# Patient Record
Sex: Male | Born: 1970 | Hispanic: Yes | Smoking: Never smoker
Health system: Southern US, Community
[De-identification: ages and names within clinical notes are randomized; demographics above are authoritative.]

## PROBLEM LIST (undated history)

## (undated) DIAGNOSIS — E119 Type 2 diabetes mellitus without complications: Secondary | ICD-10-CM

## (undated) DIAGNOSIS — E785 Hyperlipidemia, unspecified: Secondary | ICD-10-CM

---

## 2015-12-20 ENCOUNTER — Ambulatory Visit (INDEPENDENT_AMBULATORY_CARE_PROVIDER_SITE_OTHER): Payer: 59 | Admitting: Physician Assistant

## 2015-12-20 ENCOUNTER — Ambulatory Visit (INDEPENDENT_AMBULATORY_CARE_PROVIDER_SITE_OTHER): Payer: 59

## 2015-12-20 VITALS — BP 120/82 | HR 83 | Temp 98.3°F | Resp 16 | Ht 64.5 in | Wt 163.0 lb

## 2015-12-20 DIAGNOSIS — L03811 Cellulitis of head [any part, except face]: Secondary | ICD-10-CM

## 2015-12-20 MED ORDER — DOXYCYCLINE HYCLATE 100 MG PO CAPS: 100.0000 mg | ORAL_CAPSULE | Freq: Two times a day (BID) | ORAL | Status: AC

## 2015-12-20 NOTE — Progress Notes (Signed)
[facility] [address] [place] [address] [phone]  Date:  12/20/2015   Name:  Brian Rivas   DOB:  1971-03-17   MRN:  [id number]  PCP:  No primary care provider on file.    History of Present Illness:  Brian Rivas is a 45 y.o. male patient who presents to [facility] for cc of abscess on his neck.  This started about 1 week ago and has progressively grown.  He has had no drainage or fever.  There swelling and redness.  He has not taken any medications.     There are no active problems to display for this patient.   History reviewed. No pertinent past medical history.  History reviewed. No pertinent past surgical history.  Social History  Substance Use Topics  . Smoking status: Never Smoker   . Smokeless tobacco: None  . Alcohol Use: No    Family History  Problem Relation Age of Onset  . Cancer Mother     stomach  . Diabetes Father     No Known Allergies  Medication list has been reviewed and updated.  No current outpatient prescriptions on file prior to visit.   No current facility-administered medications on file prior to visit.    ROS ROS otherwise unremarkable unless listed above.   Physical Examination: BP 120/82 mmHg  Pulse 83  Temp(Src) 98.3 F (36.8 C) (Oral)  Resp 16  Ht 5' 4.5" (1.638 m)  Wt 163 lb (73.936 kg)  BMI 27.56 kg/m2  SpO2 97% Ideal Body Weight: Weight in (lb) to have BMI = 25: 147.6  Physical Exam  Constitutional: He is oriented to person, place, and time. He appears well-developed and well-nourished. No distress.  HENT:  Head: Normocephalic and atraumatic.  Eyes: Conjunctivae and EOM are normal. Pupils are equal, round, and reactive to light.  Neck:  Redness and firm with minimal fluctuance.  Central purlent entrance, however not open.    Cardiovascular: Normal rate.   Pulmonary/Chest: Effort normal. No respiratory distress.  Neurological: He is alert and oriented to person, place, and time.   Skin: Skin is warm and dry. He is not diaphoretic.  Psychiatric: He has a normal mood and affect. His behavior is normal.   Procedure: verbal consent obtained.  Cleansed with alcohol swab.  .5% Marcaine at abscess wound.  povidine swabbed.  11 blade utilized for 1.5cm incision.  Purulent fluid expressed and captured in culture.  Wound searched for loculations.  Irrigated with saline.  1/4 packing placed aggressively.     Assessment and Plan: Brian Rivas is a 45 y.o. male who is here today for cc of abscess of neck.   Placed on doxycycline.   Wound care discussed. rtc in 48 hrs for wound care.  Abscess or cellulitis of scalp - Plan: Wound culture, doxycycline (VIBRAMYCIN) 100 MG capsule  Trena Platt, PA-C [facility] 12/20/2015 3:04 PM

## 2015-12-20 NOTE — Patient Instructions (Addendum)
Please make sure that you are hydrating well. Please keep the wound dry.  I would like you to return in 48 hours for recheck.     Cuidados posteriores a una incisin y drenaje  (Incision and Drainage, Care After) Siga estas instrucciones durante las prximas semanas. Estas indicaciones le proporcionan informacin general acerca de cmo deber cuidarse despus del procedimiento. El mdico tambin podr darle instrucciones ms especficas. El tratamiento ha sido planificado segn las prcticas mdicas actuales, pero en algunos casos pueden ocurrir problemas. Comunquese con el mdico si tiene algn problema o tiene preguntas despus del procedimiento.  INSTRUCCIONES PARA EL CUIDADO EN EL HOGAR   Si le han recetado antibiticos, tmelos segn las indicaciones. Finalice la prescripcin completa, aunque se sienta mejor.  Slo tome medicamentos de venta libre o recetados para Primary school teacher, las molestias o bajar la fiebre segn las indicaciones de su mdico.  Cumpla con todas las visitas de control, segn le indique su mdico.  Cambie los apsitos vendajes tal como le indic su mdico. Reemplace los vendajes usados por vendajes limpios.  Lvese bien las manos antes y despus de curar la herida. Recibir instrucciones especficas para limpiar y cuidar la herida.  SOLICITE ATENCIN MDICA SI:   Aumenta el dolor, la hinchazn o el enrojecimiento en la herida.  Aumenta la secrecin, la herida tiene mal olor o [place].  Tiene dolores musculares, escalofros o siente Risk analyst.  Lance Muss. ASEGRESE DE QUE:   Comprende estas instrucciones.  Controlar su enfermedad.  Solicitar ayuda de inmediato si no mejora o si empeora.   Esta informacin no tiene Theme park manager el consejo del mdico. Asegrese de hacerle al mdico cualquier pregunta que tenga.   Document Released: 04/23/2012 Elsevier Interactive Patient Education [employer]

## 2015-12-22 ENCOUNTER — Ambulatory Visit (INDEPENDENT_AMBULATORY_CARE_PROVIDER_SITE_OTHER): Payer: 59 | Admitting: Physician Assistant

## 2015-12-22 VITALS — BP 124/80 | HR 81 | Temp 97.4°F | Resp 17 | Ht 64.5 in | Wt 162.0 lb

## 2015-12-22 DIAGNOSIS — Z4889 Encounter for other specified surgical aftercare: Secondary | ICD-10-CM

## 2015-12-22 DIAGNOSIS — L03811 Cellulitis of head [any part, except face]: Secondary | ICD-10-CM

## 2015-12-22 DIAGNOSIS — Z9889 Other specified postprocedural states: Secondary | ICD-10-CM

## 2015-12-22 NOTE — Progress Notes (Signed)
[facility] [address], [place] [address] [phone]  Date:  12/22/2015   Name:  Brian Rivas   DOB:  1971-07-26   MRN:  [id number]  PCP:  No primary care provider on file.    History of Present Illness:  Brian Rivas is a 45 y.o. male patient who presents to [facility] for follow up wound care. Patient was seen here 2 days ago for an abscess of his lower left scalp status post incision and drainage.  Patient reports a lot of mucus drainage.  He has had no fever.  It is less painful, and swelling has gone down.  He is compliant on the doxycycline.      There are no active problems to display for this patient.   No past medical history on file.  No past surgical history on file.  Social History  Substance Use Topics  . Smoking status: Never Smoker   . Smokeless tobacco: None  . Alcohol Use: No    Family History  Problem Relation Age of Onset  . Cancer Mother     stomach  . Diabetes Father     No Known Allergies  Medication list has been reviewed and updated.  Current Outpatient Prescriptions on File Prior to Visit  Medication Sig Dispense Refill  . doxycycline (VIBRAMYCIN) 100 MG capsule Take 1 capsule (100 mg total) by mouth 2 (two) times daily. 20 capsule 0  . ibuprofen (ADVIL,MOTRIN) 200 MG tablet Take 200 mg by mouth every 6 (six) hours as needed.     No current facility-administered medications on file prior to visit.    ROS   Physical Examination: BP 124/80 mmHg  Pulse 81  Temp(Src) 97.4 F (36.3 C) (Oral)  Resp 17  Ht 5' 4.5" (1.638 m)  Wt 162 lb (73.483 kg)  BMI 27.39 kg/m2  SpO2 97% Ideal Body Weight: Weight in (lb) to have BMI = 25: 147.6  Physical Exam  Constitutional: He is oriented to person, place, and time. He appears well-developed and well-nourished. No distress.  HENT:  Head: Normocephalic and atraumatic.    Packing in placed.  Minimal induration, and there is erythema surround incision site.      Eyes: Conjunctivae and EOM are normal. Pupils are equal, round, and reactive to light.  Cardiovascular: Normal rate.   Pulmonary/Chest: Effort normal. No respiratory distress.  Neurological: He is alert and oriented to person, place, and time.  Skin: Skin is warm and dry. He is not diaphoretic.  Psychiatric: He has a normal mood and affect. His behavior is normal.   Procedure: verbal consent obtained.  Purulent drainage expressed.  Irrigated with normal saline.  Beefy red tissue seen in the wound.  1/4 packing gently placed.  Cleansed with normal saline.  Dressings applied.    Assessment and Plan: Brian Rivas is a 45 y.o. male who is here today for follow up of abscess status post drainage. Advised continued doxycycline.  Preliminary results shows abundant staph aureus thus far. rtc in 48 hours.  Abscess or cellulitis of scalp  Status post incision and drainage  Encounter for postoperative wound care  Trena Platt, PA-C [facility] 2/15/20172:43 PM

## 2015-12-22 NOTE — Patient Instructions (Signed)
Continue the antibiotic at this time.  Please keep this area from getting wet.  If it does, I would like you to change the top dressing.   This is healing well.

## 2015-12-23 LAB — WOUND CULTURE: GRAM STAIN: NONE SEEN

## 2015-12-24 ENCOUNTER — Ambulatory Visit (INDEPENDENT_AMBULATORY_CARE_PROVIDER_SITE_OTHER): Payer: 59 | Admitting: Physician Assistant

## 2015-12-24 VITALS — BP 118/80 | HR 72 | Temp 98.0°F | Resp 16 | Ht 64.0 in | Wt 164.0 lb

## 2015-12-24 DIAGNOSIS — Z9889 Other specified postprocedural states: Secondary | ICD-10-CM

## 2015-12-24 DIAGNOSIS — Z4889 Encounter for other specified surgical aftercare: Secondary | ICD-10-CM

## 2015-12-24 NOTE — Patient Instructions (Signed)
Remove packing in 2 days. Wash area with soap. This can be antibacterial soap. Just no deodorant soap or perfume soap. Keep a bandage cover the wound until it heals. If you notice any redness, increased swelling, drainage, or pain, please return to the clinic or go to the nearest hospital.

## 2015-12-24 NOTE — Progress Notes (Signed)
[facility] [address] [place] [address] [phone]  Date:  12/24/2015   Name:  Brian Rivas   DOB:  08-May-1971   MRN:  [id number]  PCP:  No primary care provider on file.    History of Present Illness:  Brian Rivas is a 45 y.o. male patient who presents to [facility] for wound care follow up.  6th day status post incision and drainage of left lower scalp abscess.  Patient reports improvement of the abscess.  Minimal drainage.  No pain or fever.  Tolerating the doxycycline well.     There are no active problems to display for this patient.   History reviewed. No pertinent past medical history.  History reviewed. No pertinent past surgical history.  Social History  Substance Use Topics  . Smoking status: Never Smoker   . Smokeless tobacco: Never Used  . Alcohol Use: No    Family History  Problem Relation Age of Onset  . Cancer Mother     stomach  . Diabetes Father     No Known Allergies  Medication list has been reviewed and updated.  Current Outpatient Prescriptions on File Prior to Visit  Medication Sig Dispense Refill  . doxycycline (VIBRAMYCIN) 100 MG capsule Take 1 capsule (100 mg total) by mouth 2 (two) times daily. 20 capsule 0   No current facility-administered medications on file prior to visit.    ROS ROS otherwise unremakable unless listed above.    Physical Examination: BP 118/80 mmHg  Pulse 72  Temp(Src) 98 F (36.7 C) (Oral)  Resp 16  Ht  (1.626 m)  Wt 164 lb (74.39 kg)  BMI 28.14 kg/m2  SpO2 98% Ideal Body Weight: Weight in (lb) to have BMI = 25: 145.3  Physical Exam  Constitutional: He is oriented to person, place, and time. He appears well-developed and well-nourished. No distress.  HENT:  Head: Normocephalic and atraumatic.  Eyes: Conjunctivae and EOM are normal. Pupils are equal, round, and reactive to light.  Cardiovascular: Normal rate.   Pulmonary/Chest: Effort normal. No respiratory  distress.  Neurological: He is alert and oriented to person, place, and time.  Skin: Skin is warm and dry. He is not diaphoretic.  Wound site with minimal erythema at lower hair line.    Psychiatric: He has a normal mood and affect. His behavior is normal.   Procedure: verbal consent obtained. Packing removed.  Beefy red tissue without necrosis or purulent fluid.  Sanguinous fluid expressed.  Irrigated with saline.  Packing gently placed.   Assessment and Plan: Brian Rivas is a 45 y.o. male who is here today with follow up wound care. Repacked gently. This is healing well.  i have advised to remove packing in 48 hours.  Wound care advised.   Status post incision and drainage  Encounter for postoperative wound care  Trena Platt, PA-C [facility] 2/17/20177:09 PM

## 2016-05-15 ENCOUNTER — Ambulatory Visit: Payer: 59

## 2019-04-19 ENCOUNTER — Encounter (HOSPITAL_COMMUNITY): Payer: Self-pay | Admitting: Emergency Medicine

## 2019-04-19 ENCOUNTER — Inpatient Hospital Stay (HOSPITAL_COMMUNITY)
Admission: EM | Admit: 2019-04-19 | Discharge: 2019-04-23 | DRG: 177 | Disposition: A | Discharge status: Home / Self Care | Payer: 59 | Attending: Internal Medicine | Admitting: Internal Medicine

## 2019-04-19 ENCOUNTER — Other Ambulatory Visit: Payer: Self-pay

## 2019-04-19 ENCOUNTER — Emergency Department (HOSPITAL_COMMUNITY): Payer: 59

## 2019-04-19 DIAGNOSIS — R0602 Shortness of breath: Secondary | ICD-10-CM

## 2019-04-19 DIAGNOSIS — Z833 Family history of diabetes mellitus: Secondary | ICD-10-CM

## 2019-04-19 DIAGNOSIS — Z79899 Other long term (current) drug therapy: Secondary | ICD-10-CM | POA: Diagnosis not present

## 2019-04-19 DIAGNOSIS — E785 Hyperlipidemia, unspecified: Secondary | ICD-10-CM | POA: Diagnosis present

## 2019-04-19 DIAGNOSIS — E119 Type 2 diabetes mellitus without complications: Secondary | ICD-10-CM

## 2019-04-19 DIAGNOSIS — Z7984 Long term (current) use of oral hypoglycemic drugs: Secondary | ICD-10-CM | POA: Diagnosis not present

## 2019-04-19 DIAGNOSIS — J9601 Acute respiratory failure with hypoxia: Secondary | ICD-10-CM | POA: Diagnosis present

## 2019-04-19 DIAGNOSIS — R918 Other nonspecific abnormal finding of lung field: Secondary | ICD-10-CM

## 2019-04-19 DIAGNOSIS — R430 Anosmia: Secondary | ICD-10-CM | POA: Diagnosis present

## 2019-04-19 DIAGNOSIS — U071 COVID-19: Principal | ICD-10-CM | POA: Diagnosis present

## 2019-04-19 DIAGNOSIS — R0682 Tachypnea, not elsewhere classified: Secondary | ICD-10-CM

## 2019-04-19 DIAGNOSIS — J1289 Other viral pneumonia: Secondary | ICD-10-CM | POA: Diagnosis present

## 2019-04-19 DIAGNOSIS — J069 Acute upper respiratory infection, unspecified: Secondary | ICD-10-CM | POA: Diagnosis not present

## 2019-04-19 HISTORY — DX: Hyperlipidemia, unspecified: E78.5

## 2019-04-19 HISTORY — DX: Type 2 diabetes mellitus without complications: E11.9

## 2019-04-19 LAB — BASIC METABOLIC PANEL
Anion gap: 12 (ref 5–15)
BUN: 11 mg/dL (ref 6–20)
CO2: 23 mmol/L (ref 22–32)
Calcium: 8.5 mg/dL — ABNORMAL LOW (ref 8.9–10.3)
Chloride: 100 mmol/L (ref 98–111)
Creatinine, Ser: 0.79 mg/dL (ref 0.61–1.24)
GFR calc Af Amer: >60 mL/min (ref >60)
GFR calc non Af Amer: >60 mL/min (ref >60)
Glucose, Bld: 196 mg/dL — ABNORMAL HIGH (ref 70–99)
Potassium: 4.1 mmol/L (ref 3.5–5.1)
Sodium: 135 mmol/L (ref 135–145)

## 2019-04-19 LAB — BRAIN NATRIURETIC PEPTIDE: B Natriuretic Peptide: 28.7 pg/mL (ref 0.0–100.0)

## 2019-04-19 LAB — CBC WITH DIFFERENTIAL/PLATELET
Abs Immature Granulocytes: 0.01 10*3/uL (ref 0.00–0.07)
Basophils Absolute: 0 10*3/uL (ref 0.0–0.1)
Basophils Relative: 0 %
Eosinophils Absolute: 0 10*3/uL (ref 0.0–0.5)
Eosinophils Relative: 0 %
HCT: 47 % (ref 39.0–52.0)
Hemoglobin: 15.9 g/dL (ref 13.0–17.0)
Immature Granulocytes: 0 %
Lymphocytes Relative: 17 %
Lymphs Abs: 1.1 10*3/uL (ref 0.7–4.0)
MCH: 30.4 pg (ref 26.0–34.0)
MCHC: 33.8 g/dL (ref 30.0–36.0)
MCV: 89.9 fL (ref 80.0–100.0)
Monocytes Absolute: 0.5 10*3/uL (ref 0.1–1.0)
Monocytes Relative: 7 %
Neutro Abs: 5.1 10*3/uL (ref 1.7–7.7)
Neutrophils Relative %: 76 %
Platelets: 182 10*3/uL (ref 150–400)
RBC: 5.23 MIL/uL (ref 4.22–5.81)
RDW: 12.3 % (ref 11.5–15.5)
WBC: 6.7 10*3/uL (ref 4.0–10.5)
nRBC: 0 % (ref 0.0–0.2)

## 2019-04-19 LAB — HEPATIC FUNCTION PANEL
ALT: 22 U/L (ref 0–44)
AST: 28 U/L (ref 15–41)
Albumin: 2.9 g/dL — ABNORMAL LOW (ref 3.5–5.0)
Alkaline Phosphatase: 66 U/L (ref 38–126)
Bilirubin, Direct: 0.2 mg/dL (ref 0.0–0.2)
Indirect Bilirubin: 0.7 mg/dL (ref 0.3–0.9)
Total Bilirubin: 0.9 mg/dL (ref 0.3–1.2)
Total Protein: 6.7 g/dL (ref 6.5–8.1)

## 2019-04-19 LAB — HEMOGLOBIN A1C
Hgb A1c MFr Bld: 12.6 % — ABNORMAL HIGH (ref 4.8–5.6)
Mean Plasma Glucose: 314.92 mg/dL

## 2019-04-19 LAB — C-REACTIVE PROTEIN: CRP: 8.4 mg/dL — ABNORMAL HIGH (ref <1.0)

## 2019-04-19 LAB — FERRITIN: Ferritin: 283 ng/mL (ref 24–336)

## 2019-04-19 LAB — FIBRINOGEN: Fibrinogen: 686 mg/dL — ABNORMAL HIGH (ref 210–475)

## 2019-04-19 LAB — D-DIMER, QUANTITATIVE: D-Dimer, Quant: 1.4 ug/mL-FEU — ABNORMAL HIGH (ref 0.00–0.50)

## 2019-04-19 LAB — LACTATE DEHYDROGENASE: LDH: 274 U/L — ABNORMAL HIGH (ref 98–192)

## 2019-04-19 LAB — PROCALCITONIN: Procalcitonin: <0.1 ng/mL

## 2019-04-19 LAB — SARS CORONAVIRUS 2: SARS Coronavirus 2: DETECTED — AB

## 2019-04-19 LAB — GLUCOSE, CAPILLARY: Glucose-Capillary: 182 mg/dL — ABNORMAL HIGH (ref 70–99)

## 2019-04-19 LAB — LACTIC ACID, PLASMA: Lactic Acid, Venous: 1.3 mmol/L (ref 0.5–1.9)

## 2019-04-19 MED ORDER — BISACODYL 5 MG PO TBEC: 5.0000 mg | DELAYED_RELEASE_TABLET | Freq: Every day | ORAL | Status: DC | PRN

## 2019-04-19 MED ORDER — SODIUM CHLORIDE 0.9 % IV SOLN
500.0000 mg | Freq: Once | INTRAVENOUS | Status: AC
  Administered 2019-04-19: 500 mg via INTRAVENOUS
  Filled 2019-04-19: qty 500

## 2019-04-19 MED ORDER — POLYETHYLENE GLYCOL 3350 17 G PO PACK: 17.0000 g | PACK | Freq: Every day | ORAL | Status: DC | PRN

## 2019-04-19 MED ORDER — ACETAMINOPHEN 325 MG PO TABS
650.0000 mg | ORAL_TABLET | Freq: Once | ORAL | Status: AC
  Administered 2019-04-19: 650 mg via ORAL
  Filled 2019-04-19: qty 2

## 2019-04-19 MED ORDER — METHYLPREDNISOLONE SODIUM SUCC 125 MG IJ SOLR
80.0000 mg | Freq: Two times a day (BID) | INTRAMUSCULAR | Status: DC
  Administered 2019-04-19: 80 mg via INTRAVENOUS
  Filled 2019-04-19: qty 2

## 2019-04-19 MED ORDER — INSULIN ASPART 100 UNIT/ML ~~LOC~~ SOLN
0.0000 [IU] | Freq: Every day | SUBCUTANEOUS | Status: DC
  Administered 2019-04-20 – 2019-04-21 (×2): 2 [IU] via SUBCUTANEOUS
  Administered 2019-04-22: 22:00:00 5 [IU] via SUBCUTANEOUS

## 2019-04-19 MED ORDER — SODIUM CHLORIDE 0.9 % IV SOLN
1.0000 g | Freq: Once | INTRAVENOUS | Status: AC
  Administered 2019-04-19: 1 g via INTRAVENOUS
  Filled 2019-04-19: qty 10

## 2019-04-19 MED ORDER — SODIUM CHLORIDE 0.9 % IV SOLN
200.0000 mg | Freq: Once | INTRAVENOUS | Status: AC
  Administered 2019-04-19: 200 mg via INTRAVENOUS
  Filled 2019-04-19: qty 40

## 2019-04-19 MED ORDER — ONDANSETRON HCL 4 MG PO TABS: 4.0000 mg | ORAL_TABLET | Freq: Four times a day (QID) | ORAL | Status: DC | PRN

## 2019-04-19 MED ORDER — DOCUSATE SODIUM 100 MG PO CAPS
100.0000 mg | ORAL_CAPSULE | Freq: Two times a day (BID) | ORAL | Status: DC
  Administered 2019-04-20 – 2019-04-23 (×6): 100 mg via ORAL
  Filled 2019-04-19 (×7): qty 1

## 2019-04-19 MED ORDER — ONDANSETRON HCL 4 MG/2ML IJ SOLN: 4.0000 mg | Freq: Four times a day (QID) | INTRAMUSCULAR | Status: DC | PRN

## 2019-04-19 MED ORDER — SODIUM CHLORIDE 0.9 % IV SOLN
INTRAVENOUS | Status: DC
  Administered 2019-04-19: 22:00:00 via INTRAVENOUS

## 2019-04-19 MED ORDER — INSULIN ASPART 100 UNIT/ML ~~LOC~~ SOLN
0.0000 [IU] | Freq: Three times a day (TID) | SUBCUTANEOUS | Status: DC
  Administered 2019-04-20: 11 [IU] via SUBCUTANEOUS
  Administered 2019-04-20: 15 [IU] via SUBCUTANEOUS
  Administered 2019-04-20 – 2019-04-21 (×2): 5 [IU] via SUBCUTANEOUS
  Administered 2019-04-21 (×2): 3 [IU] via SUBCUTANEOUS
  Administered 2019-04-22: 17:00:00 8 [IU] via SUBCUTANEOUS
  Administered 2019-04-22 (×2): 5 [IU] via SUBCUTANEOUS
  Administered 2019-04-23: 8 [IU] via SUBCUTANEOUS
  Administered 2019-04-23: 5 [IU] via SUBCUTANEOUS

## 2019-04-19 MED ORDER — ATORVASTATIN CALCIUM 10 MG PO TABS
10.0000 mg | ORAL_TABLET | Freq: Every day | ORAL | Status: DC
  Administered 2019-04-20 – 2019-04-23 (×4): 10 mg via ORAL
  Filled 2019-04-19 (×4): qty 1

## 2019-04-19 MED ORDER — ENOXAPARIN SODIUM 40 MG/0.4ML ~~LOC~~ SOLN
40.0000 mg | SUBCUTANEOUS | Status: DC
  Administered 2019-04-20 – 2019-04-23 (×4): 40 mg via SUBCUTANEOUS
  Filled 2019-04-19 (×4): qty 0.4

## 2019-04-19 MED ORDER — SODIUM CHLORIDE 0.9 % IV SOLN
100.0000 mg | INTRAVENOUS | Status: DC
  Administered 2019-04-20 – 2019-04-22 (×3): 100 mg via INTRAVENOUS
  Filled 2019-04-19 (×3): qty 20

## 2019-04-19 MED ORDER — OXYCODONE HCL 5 MG PO TABS
5.0000 mg | ORAL_TABLET | ORAL | Status: DC | PRN
  Administered 2019-04-21 (×2): 5 mg via ORAL
  Filled 2019-04-19 (×2): qty 1

## 2019-04-19 MED ORDER — ACETAMINOPHEN 325 MG PO TABS
650.0000 mg | ORAL_TABLET | Freq: Four times a day (QID) | ORAL | Status: DC | PRN
  Administered 2019-04-19 – 2019-04-23 (×3): 650 mg via ORAL
  Filled 2019-04-19 (×3): qty 2

## 2019-04-19 NOTE — H&P (Signed)
History and Physical    Brian Rivas RUE:[id number] DOB: 21-Mar-1971 DOA: 04/19/2019  PCP: Patient, No Pcp Per Consultants:  None Patient coming from:  Home - lives with ; NOK: Daughter, [phone]  Chief Complaint: SOB  HPI: Brian Rivas is a 48 y.o. male with medical history significant of DM and HLD presenting with SOB.  He reports onset of a frontal headache on Wednesday.  He has had mild SOB.  + cough.  + fever.  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  ED Course:  SOB today.  Tachypnea to 30s, multifocal PNA on CXR.  COVID +.  O2 90-93% on RA.  Review of Systems: As per HPI; otherwise review of systems reviewed and negative.   Ambulatory Status:  Ambulates without assistance  Past Medical History:  Diagnosis Date  . Dyslipidemia   . Type 2 diabetes mellitus (HCC)     History reviewed. No pertinent surgical history.  Social History   Socioeconomic History  . Marital status: Divorced    Spouse name: Not on file  . Number of children: Not on file  . Years of education: Not on file  . Highest education level: Not on file  Occupational History  . Not on file  Social Needs  . Financial resource strain: Not on file  . Food insecurity    Worry: Not on file    Inability: Not on file  . Transportation needs    Medical: Not on file    Non-medical: Not on file  Tobacco Use  . Smoking status: Never Smoker  . Smokeless tobacco: Never Used  Substance and Sexual Activity  . Alcohol use: No    Alcohol/week: 0.0 standard drinks  . Drug use: No  . Sexual activity: Not on file  Lifestyle  . Physical activity    Days per week: Not on file    Minutes per session: Not on file  . Stress: Not on file  Relationships  . Social Musicianconnections    Talks on phone: Not on file    Gets together: Not on file    Attends religious service: Not on file    Active member of club or organization: Not on file    Attends meetings of clubs or  organizations: Not on file    Relationship status: Not on file  . Intimate partner violence    Fear of current or ex partner: Not on file    Emotionally abused: Not on file    Physically abused: Not on file    Forced sexual activity: Not on file  Other Topics Concern  . Not on file  Social History Narrative  . Not on file    No Known Allergies  Family History  Problem Relation Age of Onset  . Cancer Mother        stomach  . Diabetes Father     Prior to Admission medications   Medication Sig Start Date End Date Taking? Authorizing Provider  atorvastatin (LIPITOR) 10 MG tablet Take 10 mg by mouth daily. 11/26/18  Yes [provider]  metFORMIN (GLUCOPHAGE) 1000 MG tablet Take 1,000 mg by mouth 2 (two) times daily with a meal. 02/14/19  Yes [provider]    Physical Exam: Vitals:   04/19/19 1730 04/19/19 1745 04/19/19 1800 04/19/19 1830  BP: 124/80 127/78 126/83 120/77  Pulse: 86 87 86 83  Resp: (!) 25 (!) 23 (!) 31 (!) 26  Temp:  TempSrc:      SpO2: 93% 92% 94% 92%     . General:  Appears calm and comfortable and is NAD . Eyes:  PERRL, EOMI, normal lids, iris . ENT:  grossly normal hearing, lips & tongue, mmm; appropriate dentition . Neck:  no LAD, masses or thyromegaly . Cardiovascular:  RRR, no m/r/g. No LE edema.  Marland Kitchen Respiratory:   CTA bilaterally with no wheezes/rales/rhonchi.  Normal respiratory effort. . Abdomen:  soft, NT, ND, NABS . Back:   normal alignment, no CVAT . Skin:  no rash or induration seen on limited exam . Musculoskeletal:  grossly normal tone BUE/BLE, good ROM, no bony abnormality . Lower extremity:  No LE edema.  Limited foot exam with no ulcerations.  2+ distal pulses. Marland Kitchen Psychiatric:  grossly normal mood and affect, speech fluent and appropriate, AOx3 . Neurologic:  CN 2-12 grossly intact, moves all extremities in coordinated fashion, sensation intact    Radiological Exams on Admission: Dg Chest Portable 1 View   Result Date: 04/19/2019 CLINICAL DATA:  Cough since yesterday with chest pain.  Fever. EXAM: PORTABLE CHEST 1 VIEW COMPARISON:  None. FINDINGS: Indistinct airspace opacity in the left mid lung. Indistinct linear opacities at the lung bases. Low lung volumes.  Upper normal heart size.  No pleural effusion. IMPRESSION: 1. Indistinct airspace opacity in the left mid lung with questionable linear opacities at the lung bases. Possibilities might include bacterial pneumonia, atypical pneumonia, or aspiration pneumonitis. Electronically Signed   By: Van Clines M.D.   On: 04/19/2019 14:39    EKG: Independently reviewed.  Sinus tachycardia with rate 104; nonspecific ST changes with no evidence of acute ischemia   Labs on Admission: I have personally reviewed the available labs and imaging studies at the time of the admission.  Pertinent labs:   Glucose 196 Lactate 1.3 Normal CBC COVID POSITIVE  Assessment/Plan Principal Problem:   Acute respiratory disease due to COVID-19 virus Active Problems:   Type 2 diabetes mellitus (HCC)   Dyslipidemia   Acute respiratory failure with hypoxia -Patient with presenting with SOB and fever; also with cough and headache -Mild anosmia noted without the presence of other GI symptoms -No known COVID contacts -He does not have a usual home O2 requirement and is currently with O2 sats as low as 91% -COVID POSITIVE -The patient has comorbidities which may increase the risk for ARDS/MODS including: DM -COVID labs are pending -CXR with multifocal opacities which may be c/w COVID  -Will treat with broad-spectrum antibiotics if procalcitonin >0.1; currently it is pending.   -Will admit to [facility] for further evaluation, close monitoring, and treatment -Monitor on telemetry x at least 24 hours -At this time, will attempt to avoid use of aerosolized medications and use HFAs instead -Will check daily labs including BMP with Mag, Phos; LFTs; CBC with differential;  CRP; ferritin; fibrinogen; D-dimer  -Will order steroids (1 mg/kg divided TID) and Remdesivir (pharmacy consult) given +COVID test, +CXR, and hypoxia <94% on room air -If the patient shows clinical deterioration, consider transfer to ICU with PCCM consultation -If the patient is hypoxic or on >3L oxygen from baseline or CRP >7, considerTocilizumab 8 mg/kg x1 IF + COVID test; O2 sats <88% on room air; and patient is at high risk for intubation.  Will defer to [facility] doctors regarding this medication since it is being used off label. -Will attempt to maintain euvolemia to a net negative fluid status -Will ask the patient to maintain an awake prone  position for 16+ hours a day, if possible, with a minimum of 2-3 hours at a time -Will use standard-dosed Lovenox for DVT prevention -Patient was seen wearing full PPE including: gown, gloves, head cover, N95, and face shield; donning and doffing was in compliance with current standards.  DM -Will check A1c -hold Glucophage -Cover with moderate-scale SSI  HLD -Continue Lipitor   DVT prophylaxis:  Lovenox  Code Status:  Full  Family Communication: None present; patient declined having me call his daughter Disposition Plan:  Home once clinically improved Consults called: None  Admission status: Admit - It is my clinical opinion that admission to INPATIENT is reasonable and necessary because of the expectation that this patient will require hospital care that crosses at least 2 midnights to treat this condition based on the medical complexity of the problems presented.  Given the aforementioned information, the predictability of an adverse outcome is felt to be significant.     Jonah BlueJennifer Ronen Bromwell MD [facility]   How to contact the [facility] Attending or Consulting provider 7A - 7P or covering provider during after hours 7P -7A, for this patient?  1. Check the care team in [facility] and look for a) attending/consulting TR[facility] provider listed and b) the [facility] team  listed 2. Log into [email or web address] and use [place]'s universal password to access. If you do not have the password, please contact the hospital operator. 3. Locate the [facility] provider you are looking for under [facility] and page to a number that you can be directly reached. 4. If you still have difficulty reaching the provider, please page the [facility] (Director on Call) for the Hospitalists listed on amion for assistance.   04/19/2019, 7:19 PM

## 2019-04-19 NOTE — Progress Notes (Signed)
Pharmacy Brief Note  O:  ALT: pending CXR: possible PNA SpO2: 92% on RA  A/P:  Patient meets criteria for remdesivir. Will initiate remdesivir 200 mg iv once followed by 100 mg iv daily.   Ulice Dash, PharmD, BCPS Clinical Pharmacist

## 2019-04-19 NOTE — ED Provider Notes (Signed)
covid vs cap 48 y/o male with DM Tachypnea with hypoxia weakness Afebrile.  CXR shows possible multilobar pneumonia He will need admission- awaiting SARS test.   5:26 PM Patient with + COVID 19 He is maintaing his 02 saturations above 90%, however he is tachypneic and his oxygen saturation is low.   Patient admitted to the hospitalist service.  I discussed case with Dr. Lorin Mercy who will admit him for hypoxic respiratory failure, multi lobar pneumonia and positive coronavirus test    Margarita Mail, PA-C 04/20/19 0006    Drenda Freeze, MD 04/20/19 1517

## 2019-04-19 NOTE — ED Triage Notes (Signed)
Pt here from home with c/o h/a since yesterday , comes and goes , some sob

## 2019-04-19 NOTE — Progress Notes (Signed)
Patient was admitted to room 151 at approximately 2130 in no acute distress. SPO2 92% on RA. Afebrile. C/o mild H/A for which the pt received 650mg  Tylenol with desired results. Remdesivir course has been initiated. Patient is a known diabetic with HS cbg 186 tonight requiring no SSI. Patient was hungry and was provided with a microwave carb-mod appropriate meal, sugar free pudding and sugar free jello. Appetite good. Will continue to monitor.  Attempted to contact the patient's daughter to introduce my self and provide an update at phone number provided by patient [phone]]. Called and texted with no answer or response.

## 2019-04-19 NOTE — ED Provider Notes (Signed)
[facility] Provider Note   CSN: [id number] Arrival date & time: 04/19/19  1332    History   Chief Complaint Chief Complaint  Patient presents with  . Headache  . Shortness of Breath    HPI Brian Rivas is a 48 y.o. male with history of diabetes presents to emergency department today with chief complaint of shortness of breath and headache x2 days.  Patient states he overall just has not been feeling well.  His headache has been intermittent and feels better today compared to yesterday. He describes it as a tension headache that feels like he is a band squeezing his head.  He denies sudden onset.  Headache is similar to ones he has had in the past. Patient states while walking he felt short of breath yesterday. He denies history of dyspnea or asthma.  Also noticed this morning that he could not taste his food. He has not checked his temperature at home but thought he felt warm. Pt has new rash on left forearm. It does not itch and he has not applied any medicine to it prior to arrival. He denies any sick contacts or contact with anyone positive for COVID. He denies neck pain, visual changes, dizziness, abdominal pain, nausea, vomiting.  History reviewed. No pertinent past medical history.  There are no active problems to display for this patient.   History reviewed. No pertinent surgical history.      Home Medications    Prior to Admission medications   Medication Sig Start Date End Date Taking? Authorizing Provider  atorvastatin (LIPITOR) 10 MG tablet Take 10 mg by mouth daily. 11/26/18  Yes [provider]  metFORMIN (GLUCOPHAGE) 1000 MG tablet Take 1,000 mg by mouth 2 (two) times daily with a meal. 02/14/19  Yes [provider]    Family History Family History  Problem Relation Age of Onset  . Cancer Mother        stomach  . Diabetes Father     Social History Social History   Tobacco Use  . Smoking  status: Never Smoker  . Smokeless tobacco: Never Used  Substance Use Topics  . Alcohol use: No    Alcohol/week: 0.0 standard drinks  . Drug use: No     Allergies   Patient has no known allergies.   Review of Systems Review of Systems  Constitutional: Positive for fever. Negative for chills.  HENT: Negative for congestion, rhinorrhea, sinus pressure and sore throat.   Eyes: Negative for pain and redness.  Respiratory: Positive for shortness of breath. Negative for cough and wheezing.   Cardiovascular: Negative for chest pain and palpitations.  Gastrointestinal: Negative for abdominal pain, constipation, diarrhea, nausea and vomiting.  Genitourinary: Negative for dysuria.  Musculoskeletal: Negative for arthralgias, back pain, myalgias and neck pain.  Skin: Positive for rash. Negative for wound.  Neurological: Positive for headaches. Negative for dizziness, syncope, weakness and numbness.  Psychiatric/Behavioral: Negative for confusion.     Physical Exam Updated Vital Signs BP 128/75   Pulse (!) 103   Temp 99.1 F (37.3 C) (Oral)   Resp (!) 32   SpO2 93%   Physical Exam Vitals signs and nursing note reviewed.  Constitutional:      General: He is not in acute distress.    Appearance: He is not ill-appearing.  HENT:     Head: Normocephalic and atraumatic.     Right Ear: Tympanic membrane and external ear normal.     Left Ear:  Tympanic membrane and external ear normal.     Nose: Nose normal.     Mouth/Throat:     Mouth: Mucous membranes are moist.     Pharynx: Oropharynx is clear.  Eyes:     General: No scleral icterus.       Right eye: No discharge.        Left eye: No discharge.     Extraocular Movements: Extraocular movements intact.     Conjunctiva/sclera: Conjunctivae normal.     Pupils: Pupils are equal, round, and reactive to light.  Neck:     Musculoskeletal: Normal range of motion.     Vascular: No JVD.  Cardiovascular:     Rate and Rhythm: Regular  rhythm. Tachycardia present.     Pulses: Normal pulses.          Radial pulses are 2+ on the right side and 2+ on the left side.     Heart sounds: Normal heart sounds.  Pulmonary:     Comments: Lung sounds diminished throughout.. Symmetric chest rise. No wheezing, rales, or rhonchi. SpO2 is 93% on room air during exam. No accessory muscle use. Pt is speaking in full sentences. Abdominal:     Comments: Abdomen is soft, non-distended, and non-tender in all quadrants. No rigidity, no guarding. No peritoneal signs.  Musculoskeletal: Normal range of motion.  Skin:    General: Skin is warm and dry.     Capillary Refill: Capillary refill takes less than 2 seconds.     Findings: Rash (left forearm, erythematous papules) present.  Neurological:     Mental Status: He is oriented to person, place, and time.     GCS: GCS eye subscore is 4. GCS verbal subscore is 5. GCS motor subscore is 6.     Comments: Speech is clear and goal oriented, follows commands CN III-XII intact, no facial droop Normal strength in upper and lower extremities bilaterally including dorsiflexion and plantar flexion, strong and equal grip strength Sensation normal to light and sharp touch Moves extremities without ataxia, coordination intact Normal finger to nose and rapid alternating movements Normal gait and balance  Psychiatric:        Behavior: Behavior normal.      ED Treatments / Results  Labs (all labs ordered are listed, but only abnormal results are displayed) Labs Reviewed  BASIC METABOLIC PANEL - Abnormal; Notable for the following components:      Result Value   Glucose, Bld 196 (*)    Calcium 8.5 (*)    All other components within normal limits  CULTURE, BLOOD (ROUTINE X 2)  CULTURE, BLOOD (ROUTINE X 2)  SARS CORONAVIRUS 2  CBC WITH DIFFERENTIAL/PLATELET  LACTIC ACID, PLASMA  URINALYSIS, ROUTINE W REFLEX MICROSCOPIC    EKG None  Radiology Dg Chest Portable 1 View  Result Date: 04/19/2019  CLINICAL DATA:  Cough since yesterday with chest pain.  Fever. EXAM: PORTABLE CHEST 1 VIEW COMPARISON:  None. FINDINGS: Indistinct airspace opacity in the left mid lung. Indistinct linear opacities at the lung bases. Low lung volumes.  Upper normal heart size.  No pleural effusion. IMPRESSION: 1. Indistinct airspace opacity in the left mid lung with questionable linear opacities at the lung bases. Possibilities might include bacterial pneumonia, atypical pneumonia, or aspiration pneumonitis. Electronically Signed   By: Gaylyn RongWalter  Liebkemann M.D.   On: 04/19/2019 14:39    Procedures Procedures (including critical care time)  Medications Ordered in ED Medications  cefTRIAXone (ROCEPHIN) 1 g in sodium chloride 0.9 %  100 mL IVPB (has no administration in time range)  azithromycin (ZITHROMAX) 500 mg in sodium chloride 0.9 % 250 mL IVPB (has no administration in time range)  acetaminophen (TYLENOL) tablet 650 mg (650 mg Oral Given 04/19/19 1351)     Initial Impression / Assessment and Plan / ED Course  I have reviewed the triage vital signs and the nursing notes.  Pertinent labs & imaging results that were available during my care of the patient were reviewed by me and considered in my medical decision making (see chart for details).  48 year old male presents with headache and shortness of breath.  On arrival low-grade temp of 99.1 with tachypnea, with SpO2 93% on room air. He is speaking in full sentences. Lung sounds diminished throughout. CBC unremarkable no leukocytosis. BMP with glucose of 196, otherwise unremarkable. Lactic acid 1.2,  within normal range. Blood cultures pending.  Chest xray viewed by me is concerning for pneumonia in left lung. Will start IV zithromax and rocephin. Covid test is pending. Pt has been discussed with attending Dr. Jeanell Sparrow and she agrees to plan.  Patient care transferred to A. Harris PA-C at the end of my shift. Patient presentation, ED course, and plan of care  discussed with review of all pertinent labs and imaging. Please see his/her note for further details regarding further ED course and disposition. Suspect pt will need admission given tachypnea and hypoxia.  Vitals:   04/19/19 1445 04/19/19 1500  BP: 121/78 126/77  Pulse: 97 95  Resp: (!) 32 (!) 34  Temp:    SpO2: 94% 91%  , This note was prepared using Dragon voice recognition software and may include unintentional dictation errors due to the inherent limitations of voice recognition software.    Final Clinical Impressions(s) / ED Diagnoses   Final diagnoses:  None    ED Discharge Orders    None       Cherre Robins, PA-C 04/19/19 1556    Pattricia Boss, MD 04/20/19 1342

## 2019-04-20 LAB — COMPREHENSIVE METABOLIC PANEL
ALT: 21 U/L (ref 0–44)
AST: 25 U/L (ref 15–41)
Albumin: 3.1 g/dL — ABNORMAL LOW (ref 3.5–5.0)
Alkaline Phosphatase: 68 U/L (ref 38–126)
Anion gap: 12 (ref 5–15)
BUN: 18 mg/dL (ref 6–20)
CO2: 24 mmol/L (ref 22–32)
Calcium: 8.4 mg/dL — ABNORMAL LOW (ref 8.9–10.3)
Chloride: 102 mmol/L (ref 98–111)
Creatinine, Ser: 0.7 mg/dL (ref 0.61–1.24)
GFR calc Af Amer: >60 mL/min (ref >60)
GFR calc non Af Amer: >60 mL/min (ref >60)
Glucose, Bld: 235 mg/dL — ABNORMAL HIGH (ref 70–99)
Potassium: 4.4 mmol/L (ref 3.5–5.1)
Sodium: 138 mmol/L (ref 135–145)
Total Bilirubin: 0.4 mg/dL (ref 0.3–1.2)
Total Protein: 7.1 g/dL (ref 6.5–8.1)

## 2019-04-20 LAB — CBC WITH DIFFERENTIAL/PLATELET
Abs Immature Granulocytes: 0.02 10*3/uL (ref 0.00–0.07)
Basophils Absolute: 0 10*3/uL (ref 0.0–0.1)
Basophils Relative: 0 %
Eosinophils Absolute: 0 10*3/uL (ref 0.0–0.5)
Eosinophils Relative: 0 %
HCT: 48.6 % (ref 39.0–52.0)
Hemoglobin: 15.9 g/dL (ref 13.0–17.0)
Immature Granulocytes: 0 %
Lymphocytes Relative: 11 %
Lymphs Abs: 0.6 10*3/uL — ABNORMAL LOW (ref 0.7–4.0)
MCH: 30.2 pg (ref 26.0–34.0)
MCHC: 32.7 g/dL (ref 30.0–36.0)
MCV: 92.4 fL (ref 80.0–100.0)
Monocytes Absolute: 0.2 10*3/uL (ref 0.1–1.0)
Monocytes Relative: 3 %
Neutro Abs: 4.9 10*3/uL (ref 1.7–7.7)
Neutrophils Relative %: 86 %
Platelets: 140 10*3/uL — ABNORMAL LOW (ref 150–400)
RBC: 5.26 MIL/uL (ref 4.22–5.81)
RDW: 12.3 % (ref 11.5–15.5)
WBC: 5.7 10*3/uL (ref 4.0–10.5)
nRBC: 0 % (ref 0.0–0.2)

## 2019-04-20 LAB — GLUCOSE, CAPILLARY
Glucose-Capillary: 226 mg/dL — ABNORMAL HIGH (ref 70–99)
Glucose-Capillary: 313 mg/dL — ABNORMAL HIGH (ref 70–99)
Glucose-Capillary: 375 mg/dL — ABNORMAL HIGH (ref 70–99)
Glucose-Capillary: 240 mg/dL — ABNORMAL HIGH (ref 70–99)

## 2019-04-20 LAB — D-DIMER, QUANTITATIVE: D-Dimer, Quant: 1.18 ug/mL-FEU — ABNORMAL HIGH (ref 0.00–0.50)

## 2019-04-20 LAB — HIV ANTIBODY (ROUTINE TESTING W REFLEX): HIV Screen 4th Generation wRfx: NONREACTIVE

## 2019-04-20 LAB — FERRITIN: Ferritin: 335 ng/mL (ref 24–336)

## 2019-04-20 LAB — PHOSPHORUS: Phosphorus: 3.9 mg/dL (ref 2.5–4.6)

## 2019-04-20 LAB — MAGNESIUM: Magnesium: 2.1 mg/dL (ref 1.7–2.4)

## 2019-04-20 LAB — C-REACTIVE PROTEIN: CRP: 11.2 mg/dL — ABNORMAL HIGH (ref <1.0)

## 2019-04-20 MED ORDER — METHYLPREDNISOLONE SODIUM SUCC 125 MG IJ SOLR
60.0000 mg | Freq: Every day | INTRAMUSCULAR | Status: DC
  Administered 2019-04-21 – 2019-04-23 (×3): 60 mg via INTRAVENOUS
  Filled 2019-04-20 (×3): qty 2

## 2019-04-20 MED ORDER — INSULIN GLARGINE 100 UNIT/ML ~~LOC~~ SOLN
15.0000 [IU] | Freq: Every day | SUBCUTANEOUS | Status: DC
  Administered 2019-04-20: 15 [IU] via SUBCUTANEOUS
  Filled 2019-04-20 (×2): qty 0.15

## 2019-04-20 MED ORDER — INSULIN STARTER KIT- PEN NEEDLES (SPANISH)
1.0000 | Freq: Once | Status: AC
  Administered 2019-04-20: 1
  Filled 2019-04-20: qty 1

## 2019-04-20 MED ORDER — ALUM & MAG HYDROXIDE-SIMETH 200-200-20 MG/5ML PO SUSP
30.0000 mL | ORAL | Status: DC | PRN
  Administered 2019-04-20: 30 mL via ORAL
  Filled 2019-04-20: qty 30

## 2019-04-20 MED ORDER — LIVING WELL WITH DIABETES BOOK - IN SPANISH
Freq: Once | Status: AC
  Administered 2019-04-20: 16:00:00
  Filled 2019-04-20 (×2): qty 1

## 2019-04-20 NOTE — Progress Notes (Addendum)
pts oxygen saturation continues to fluctuate between 88-90. Applied 2L [place]. Pt given flutter valve, educated and encouraged use. Will update MD.

## 2019-04-20 NOTE — Progress Notes (Signed)
PROGRESS NOTE                                                                                                                                                                                                             Patient Demographics:    Brian Rivas, is a 48 y.o. male, DOB - 05/26/1971, LTJ:[id number]  Outpatient Primary MD for the patient is Patient, No Pcp Per    LOS - 1  Admit date - 04/19/2019    Chief Complaint  Patient presents with  . Headache  . Shortness of Breath       Brief Narrative  Brian Rivas is a 48 y.o. male with medical history significant of DM and HLD presenting with SOB.  He reports onset of a frontal headache on Wednesday.  He has had mild SOB.  + cough.  + fever.  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 In the ER -   SOB, Tachypnea to 30s, multifocal PNA on CXR.  COVID +.  O2 90-93% on RA.   Subjective:    Brian Rivas today has, No headache, No chest pain, No abdominal pain - No Nausea, No new weakness tingling or numbness, Improved Cough & SOB.     Assessment  & Plan :     1. Acute Hypoxic Resp. Failure due to Acute Covid 19 Viral Illness during the ongoing 2020 Covid 19 Pandemic - with mild hypoxia, positive chest x-ray and borderline elevated inflammatory markers.  Agree with steroids and IV REMDESIVIR, complete course increase activity, added flutter valve and I-S for pulmonary toiletry increase activity.    COVID-19 Labs  Recent Labs    04/19/19 2039 04/20/19 0500  DDIMER 1.40* 1.18*  FERRITIN 283 335  LDH 274*  --   CRP 8.4* 11.2*    Lab Results  Component Value Date   SARSCOV2NAA DETECTED (A) 04/19/2019     Hepatic Function Latest Ref Rng & Units 04/20/2019 04/19/2019  Total Protein 6.5 - 8.1 g/dL 7.1 6.7  Albumin 3.5 - 5.0 g/dL 3.1(L) 2.9(L)  AST 15 - 41 U/L 25 28  ALT 0 - 44 U/L 21 22  Alk Phosphatase 38 - 126 U/L 68  66  Total Bilirubin 0.3 - 1.2 mg/dL 0.4 0.9  Bilirubin, Direct 0.0 - 0.2 mg/dL - 0.2  Component Value Date/Time   BNP 28.7 04/19/2019 2039      2.  Dyslipidemia.  Continue home dose Lipitor.   3.  DM type II.  Currently on sliding scale, since he is on steroids will add low-dose Lantus and monitor.  Extremely poor outpatient control due to hyperglycemia, will provide diabetic and insulin education.  CBG (last 3)  Recent Labs    04/19/19 2126 04/20/19 0825  GLUCAP 182* 226*   Lab Results  Component Value Date   HGBA1C 12.6 (H) 04/19/2019         Condition - Extremely Guarded  Family Communication  :  None  Code Status :  Full  Diet :   Diet Order            Diet Carb Modified Fluid consistency: Thin; Room service appropriate? Yes  Diet effective now               Disposition Plan  :  Home   Consults  :  None  Procedures  :  None  PUD Prophylaxis :   DVT Prophylaxis  :  Lovenox   Lab Results  Component Value Date   PLT 140 (L) 04/20/2019    Inpatient Medications  Scheduled Meds: . atorvastatin  10 mg Oral Daily  . docusate sodium  100 mg Oral BID  . enoxaparin (LOVENOX) injection  40 mg Subcutaneous Q24H  . insulin aspart  0-15 Units Subcutaneous TID WC  . insulin aspart  0-5 Units Subcutaneous QHS  . methylPREDNISolone (SOLU-MEDROL) injection  80 mg Intravenous Q12H   Continuous Infusions: . sodium chloride 10 mL/hr at 04/19/19 2220  . remdesivir 100 mg in NS 250 mL     PRN Meds:.acetaminophen, bisacodyl, ondansetron **OR** ondansetron (ZOFRAN) IV, oxyCODONE, polyethylene glycol  Antibiotics  :    Anti-infectives (From admission, onward)   Start     Dose/Rate Route Frequency Ordered Stop   04/20/19 2300  remdesivir 100 mg in sodium chloride 0.9 % 230 mL IVPB     100 mg 500 mL/hr over 30 Minutes Intravenous Every 24 hours 04/19/19 2146 04/24/19 2259   04/19/19 2300  remdesivir 200 mg in sodium chloride 0.9 % 210 mL IVPB      200 mg 500 mL/hr over 30 Minutes Intravenous Once 04/19/19 2146 04/19/19 2259   04/19/19 1530  cefTRIAXone (ROCEPHIN) 1 g in sodium chloride 0.9 % 100 mL IVPB     1 g 200 mL/hr over 30 Minutes Intravenous  Once 04/19/19 1519 04/19/19 1733   04/19/19 1530  azithromycin (ZITHROMAX) 500 mg in sodium chloride 0.9 % 250 mL IVPB     500 mg 250 mL/hr over 60 Minutes Intravenous  Once 04/19/19 1519 04/19/19 1733       Time Spent in minutes  30   Lala Lund M.D on 04/20/2019 at 8:59 AM  To page go to [email or web address] - password [facility]  [facility] -  Office  [phone]   See all Orders from today for further details    Objective:   Vitals:   04/20/19 0400 04/20/19 0500 04/20/19 0620 04/20/19 0846  BP: 109/68   117/75  Pulse: 76 76 78 86  Resp: (!) 21 (!) 23 (!) 21 (!) 23  Temp:    98.2 F (36.8 C)  TempSrc:    Oral  SpO2: 90% 91% 90% 90%  Weight:      Height:        Wt Readings from Last 3 Encounters:  04/19/19  74.8 kg  12/24/15 74.4 kg  12/22/15 73.5 kg     Intake/Output Summary (Last 24 hours) at 04/20/2019 0859 Last data filed at 04/19/2019 2345 Gross per 24 hour  Intake 120 ml  Output -  Net 120 ml     Physical Exam  Awake Alert, Oriented X 3, No new F.N deficits, Normal affect [place].AT,PERRAL Supple Neck,No JVD, No cervical lymphadenopathy appriciated.  Symmetrical Chest wall movement, Good air movement bilaterally, CTAB RRR,No Gallops,Rubs or new Murmurs, No Parasternal Heave +ve B.Sounds, Abd Soft, No tenderness, No organomegaly appriciated, No rebound - guarding or rigidity. No Cyanosis, Clubbing or edema, No new Rash or bruise       Data Review:    CBC Recent Labs  Lab 04/19/19 1352 04/20/19 0500  WBC 6.7 5.7  HGB 15.9 15.9  HCT 47.0 48.6  PLT 182 140*  MCV 89.9 92.4  MCH 30.4 30.2  MCHC 33.8 32.7  RDW 12.3 12.3  LYMPHSABS 1.1 0.6*  MONOABS 0.5 0.2  EOSABS 0.0 0.0  BASOSABS 0.0 0.0    Chemistries  Recent Labs  Lab 04/19/19  1352 04/19/19 2144 04/20/19 0500  NA 135  --  138  K 4.1  --  4.4  CL 100  --  102  CO2 23  --  24  GLUCOSE 196*  --  235*  BUN 11  --  18  CREATININE 0.79  --  0.70  CALCIUM 8.5*  --  8.4*  MG  --   --  2.1  AST  --  28 25  ALT  --  22 21  ALKPHOS  --  66 68  BILITOT  --  0.9 0.4   ------------------------------------------------------------------------------------------------------------------ No results for input(s): CHOL, HDL, LDLCALC, TRIG, CHOLHDL, LDLDIRECT in the last 72 hours.  Lab Results  Component Value Date   HGBA1C 12.6 (H) 04/19/2019   ------------------------------------------------------------------------------------------------------------------ No results for input(s): TSH, T4TOTAL, T3FREE, THYROIDAB in the last 72 hours.  Invalid input(s): FREET3  Cardiac Enzymes No results for input(s): CKMB, TROPONINI, MYOGLOBIN in the last 168 hours.  Invalid input(s): CK ------------------------------------------------------------------------------------------------------------------    Component Value Date/Time   BNP 28.7 04/19/2019 2039    Micro Results Recent Results (from the past 240 hour(s))  Culture, blood (routine x 2)     Status: None (Preliminary result)   Collection Time: 04/19/19  2:00 PM   Specimen: BLOOD RIGHT FOREARM  Result Value Ref Range Status   Specimen Description BLOOD RIGHT FOREARM  Final   Special Requests   Final    BOTTLES DRAWN AEROBIC AND ANAEROBIC Blood Culture adequate volume   Culture   Final    NO GROWTH < 24 HOURS Performed at [facility], [place] [address], [place], [place] [address]    Report Status PENDING  Incomplete  Culture, blood (routine x 2)     Status: None (Preliminary result)   Collection Time: 04/19/19  2:15 PM   Specimen: BLOOD  Result Value Ref Range Status   Specimen Description BLOOD LEFT ANTECUBITAL  Final   Special Requests   Final    BOTTLES DRAWN AEROBIC AND ANAEROBIC Blood Culture adequate  volume   Culture   Final    NO GROWTH < 24 HOURS Performed at [facility], [place] [address], [place], [place] [address]    Report Status PENDING  Incomplete  SARS Coronavirus 2     Status: Abnormal   Collection Time: 04/19/19  2:53 PM  Result Value Ref Range  Status   SARS Coronavirus 2 DETECTED (A) NOT DETECTED Final    Comment: CRITICAL RESULT CALLED TO, READ BACK BY AND VERIFIED WITH: Revonda Humphrey, ED RN AT 1630 ON 04/19/19 BY C. JESSUP, MLT. (NOTE) SARS-CoV-2 target nucleic acids are DETECTED. The SARS-CoV-2 RNA is generally detectable in upper and lower respiratory specimens during the acute phase of infection. Positive results are indicative of active infection with SARS-CoV-2. Clinical  correlation with patient history and other diagnostic information is necessary to determine patient infection status. Positive results do  not rule out bacterial infection or co-infection with other viruses. The expected result is Not Detected. Fact Sheet for Patients: http://www.biofiredefense.com/wp-content/uploads/2020/03/BIOFIRE-COVID -19-patients.pdf Fact Sheet for Healthcare Providers: http://www.biofiredefense.com/wp-content/uploads/2020/03/BIOFIRE-COVID -19-hcp.pdf This test is not yet approved or cleared by the [place] and  has been authorized for detection and/or diagnosis of SARS-CoV-2 by  FDA under an Emergency Use Authorization (EUA).  This EUA will remain in effect (meaning this test can be used) for the duration of  the COVID-19 declaration under Section 564(b)(1) of the Act, 21 U.S.C. section [phone] 3(b)(1), unless the authorization is terminated or revoked sooner. Performed at [facility], [place] [address], [place], [place] [address]     Radiology Reports Dg Chest Portable 1 View  Result Date: 04/19/2019 CLINICAL DATA:  Cough since yesterday with chest pain.  Fever. EXAM: PORTABLE CHEST 1 VIEW COMPARISON:  None. FINDINGS: Indistinct airspace opacity in  the left mid lung. Indistinct linear opacities at the lung bases. Low lung volumes.  Upper normal heart size.  No pleural effusion. IMPRESSION: 1. Indistinct airspace opacity in the left mid lung with questionable linear opacities at the lung bases. Possibilities might include bacterial pneumonia, atypical pneumonia, or aspiration pneumonitis. Electronically Signed   By: Van Clines M.D.   On: 04/19/2019 14:39

## 2019-04-20 NOTE — Progress Notes (Signed)
Spoke with pts daughter and updated her. She stated she would like to be called when the diabetes educator comes to talk to her father tomorrow.

## 2019-04-20 NOTE — Progress Notes (Signed)
Inpatient Diabetes Program Recommendations  AACE/ADA: New Consensus Statement on Inpatient Glycemic Control (2015)  Target Ranges:  Prepandial:   less than 140 mg/dL      Peak postprandial:   less than 180 mg/dL (1-2 hours)      Critically ill patients:  140 - 180 mg/dL   Lab Results  Component Value Date   GLUCAP 226 (H) 04/20/2019   HGBA1C 12.6 (H) 04/19/2019    Review of Glycemic Control Results for Brian Rivas, Brian Rivas (MRN [id number]) as of 04/20/2019 10:27  Ref. Range 04/19/2019 21:26 04/20/2019 08:25  Glucose-Capillary Latest Ref Range: 70 - 99 mg/dL 182 (H) 226 (H)   Diabetes history: DM 2 Outpatient Diabetes medications:  Metformin 1000 mg bid Current orders for Inpatient glycemic control:  Novolog moderate tid with meals and HS Lantus 15 units daily Solumedrol 60 mg daily Inpatient Diabetes Program Recommendations:    Referral received.  Will follow up with patient/RN on insulin teaching on 6/15. Ordered Spanish LWWD booklet and Insulin starter kit.   Thanks,  Adah Perl, RN, BC-ADM Inpatient Diabetes Coordinator Pager [phone] (8a-5p)

## 2019-04-21 LAB — COMPREHENSIVE METABOLIC PANEL
ALT: 22 U/L (ref 0–44)
AST: 21 U/L (ref 15–41)
Albumin: 2.9 g/dL — ABNORMAL LOW (ref 3.5–5.0)
Alkaline Phosphatase: 65 U/L (ref 38–126)
Anion gap: 10 (ref 5–15)
BUN: 22 mg/dL — ABNORMAL HIGH (ref 6–20)
CO2: 24 mmol/L (ref 22–32)
Calcium: 8.2 mg/dL — ABNORMAL LOW (ref 8.9–10.3)
Chloride: 105 mmol/L (ref 98–111)
Creatinine, Ser: 0.71 mg/dL (ref 0.61–1.24)
GFR calc Af Amer: >60 mL/min (ref >60)
GFR calc non Af Amer: >60 mL/min (ref >60)
Glucose, Bld: 201 mg/dL — ABNORMAL HIGH (ref 70–99)
Potassium: 4.3 mmol/L (ref 3.5–5.1)
Sodium: 139 mmol/L (ref 135–145)
Total Bilirubin: 0.3 mg/dL (ref 0.3–1.2)
Total Protein: 6.5 g/dL (ref 6.5–8.1)

## 2019-04-21 LAB — CBC WITH DIFFERENTIAL/PLATELET
Abs Immature Granulocytes: 0.03 10*3/uL (ref 0.00–0.07)
Basophils Absolute: 0 10*3/uL (ref 0.0–0.1)
Basophils Relative: 0 %
Eosinophils Absolute: 0.1 10*3/uL (ref 0.0–0.5)
Eosinophils Relative: 2 %
HCT: 47.4 % (ref 39.0–52.0)
Hemoglobin: 15.7 g/dL (ref 13.0–17.0)
Immature Granulocytes: 0 %
Lymphocytes Relative: 18 %
Lymphs Abs: 1.3 10*3/uL (ref 0.7–4.0)
MCH: 30.4 pg (ref 26.0–34.0)
MCHC: 33.1 g/dL (ref 30.0–36.0)
MCV: 91.7 fL (ref 80.0–100.0)
Monocytes Absolute: 0.5 10*3/uL (ref 0.1–1.0)
Monocytes Relative: 7 %
Neutro Abs: 5.5 10*3/uL (ref 1.7–7.7)
Neutrophils Relative %: 73 %
Platelets: 246 10*3/uL (ref 150–400)
RBC: 5.17 MIL/uL (ref 4.22–5.81)
RDW: 12.7 % (ref 11.5–15.5)
WBC: 7.6 10*3/uL (ref 4.0–10.5)
nRBC: 0 % (ref 0.0–0.2)

## 2019-04-21 LAB — C-REACTIVE PROTEIN: CRP: 7.5 mg/dL — ABNORMAL HIGH (ref <1.0)

## 2019-04-21 LAB — GLUCOSE, CAPILLARY
Glucose-Capillary: 237 mg/dL — ABNORMAL HIGH (ref 70–99)
Glucose-Capillary: 172 mg/dL — ABNORMAL HIGH (ref 70–99)
Glucose-Capillary: 199 mg/dL — ABNORMAL HIGH (ref 70–99)
Glucose-Capillary: 234 mg/dL — ABNORMAL HIGH (ref 70–99)

## 2019-04-21 LAB — MAGNESIUM: Magnesium: 2.1 mg/dL (ref 1.7–2.4)

## 2019-04-21 LAB — FERRITIN: Ferritin: 423 ng/mL — ABNORMAL HIGH (ref 24–336)

## 2019-04-21 LAB — PHOSPHORUS: Phosphorus: 2.8 mg/dL (ref 2.5–4.6)

## 2019-04-21 LAB — D-DIMER, QUANTITATIVE: D-Dimer, Quant: 0.87 ug/mL-FEU — ABNORMAL HIGH (ref 0.00–0.50)

## 2019-04-21 MED ORDER — INSULIN GLARGINE 100 UNIT/ML ~~LOC~~ SOLN
20.0000 [IU] | Freq: Every day | SUBCUTANEOUS | Status: DC
  Administered 2019-04-21 – 2019-04-22 (×2): 20 [IU] via SUBCUTANEOUS
  Filled 2019-04-21 (×2): qty 0.2

## 2019-04-21 MED ORDER — INSULIN ASPART 100 UNIT/ML ~~LOC~~ SOLN
4.0000 [IU] | Freq: Three times a day (TID) | SUBCUTANEOUS | Status: DC
  Administered 2019-04-21 – 2019-04-23 (×7): 4 [IU] via SUBCUTANEOUS

## 2019-04-21 NOTE — Progress Notes (Signed)
Inpatient Diabetes Program Recommendations  AACE/ADA: New Consensus Statement on Inpatient Glycemic Control (2015)  Target Ranges:  Prepandial:   less than 140 mg/dL      Peak postprandial:   less than 180 mg/dL (1-2 hours)      Critically ill patients:  140 - 180 mg/dL   Lab Results  Component Value Date   GLUCAP 172 (H) 04/21/2019   HGBA1C 12.6 (H) 04/19/2019    Review of Glycemic Control  Attempted to make contact with pt via phone using [employer] and RN states pt is very tired this afternoon and would I check back in am. RN also stated pt WAS on insulin at home. There is no record in EMR. Will ask pt about his meds on phone in the am.   Will continiue to follow.   Thank you. Lorenda Peck, RD, LDN, CDE Inpatient Diabetes Coordinator [phone]

## 2019-04-21 NOTE — Progress Notes (Signed)
PROGRESS NOTE                                                                                                                                                                                                             Brian Rivas Demographics:    Brian Rivas, is a 48 y.o. male, DOB - 11-12-1970, UUV:[id number]  Outpatient Primary MD for the Brian Rivas is Brian Rivas, No Pcp Per    LOS - 2  Admit date - 04/19/2019    Chief Complaint  Brian Rivas presents with  . Headache  . Shortness of Breath       Brief Narrative  Brian Rivas is a 48 y.o. male with medical history significant of DM and HLD presenting with SOB.  He reports onset of a frontal headache on Wednesday.  He has had mild SOB.  + cough.  + fever.  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 In the ER -   SOB, Tachypnea to 30s, multifocal PNA on CXR.  COVID +.  O2 90-93% on RA.   Subjective:   Brian Rivas in bed, appears comfortable, denies any headache, no fever, no chest pain or pressure, no shortness of breath , no abdominal pain. No focal weakness.   Assessment  & Plan :     1. Acute Hypoxic Resp. Failure due to Acute Covid 19 Viral Illness during the ongoing 2020 Covid 19 Pandemic - with mild hypoxia, positive chest x-ray and borderline elevated inflammatory markers.  He has been placed on IV steroids and IV REMDESIVIR, clinically improving inflammatory markers trending down continue and monitor.  Encouraged to sit up in chair use flutter valve and I-S for pulmonary toiletry, titrate down oxygen.    COVID-19 Labs  Recent Labs    04/19/19 2039 04/20/19 0500 04/21/19 0430  DDIMER 1.40* 1.18* 0.87*  FERRITIN 283 335 423*  LDH 274*  --   --   CRP 8.4* 11.2* 7.5*    Lab Results  Component Value Date   SARSCOV2NAA DETECTED (A) 04/19/2019     Hepatic Function Latest Ref Rng & Units 04/21/2019 04/20/2019 04/19/2019  Total Protein  6.5 - 8.1 g/dL 6.5 7.1 6.7  Albumin 3.5 - 5.0 g/dL 2.9(L) 3.1(L) 2.9(L)  AST 15 - 41 U/L _0 ALT 0 - 44 U/L 22 21 22  Alk Phosphatase 38 - 126 U/L 65 68 66  Total Bilirubin 0.3 - 1.2 mg/dL 0.3 0.4 0.9  Bilirubin, Direct 0.0 - 0.2 mg/dL - - 0.2        Component Value Date/Time   BNP 28.7 04/19/2019 2039      2.  Dyslipidemia.  Continue home dose Lipitor.   3.  DM type II.  Poor outpatient control due to hyperglycemia placed on Lantus sliding scale, DM and insulin education this admission.  Lantus dose adjusted and pre-meal NovoLog added for better control.  CBG (last 3)  Recent Labs    04/20/19 1632 04/20/19 2041 04/21/19 0852  GLUCAP 375* 240* 237*   Lab Results  Component Value Date   HGBA1C 12.6 (H) 04/19/2019      Condition - Extremely Guarded  Family Communication  :  None  Code Status :  Full  Diet :   Diet Order            Diet Carb Modified Fluid consistency: Thin; Room service appropriate? Yes  Diet effective now               Disposition Plan  :  Home   Consults  :  None  Procedures  :  None  PUD Prophylaxis :   DVT Prophylaxis  :  Lovenox   Lab Results  Component Value Date   PLT 246 04/21/2019    Inpatient Medications  Scheduled Meds: . atorvastatin  10 mg Oral Daily  . docusate sodium  100 mg Oral BID  . enoxaparin (LOVENOX) injection  40 mg Subcutaneous Q24H  . insulin aspart  0-15 Units Subcutaneous TID WC  . insulin aspart  0-5 Units Subcutaneous QHS  . insulin aspart  4 Units Subcutaneous TID WC  . insulin glargine  20 Units Subcutaneous Daily  . methylPREDNISolone (SOLU-MEDROL) injection  60 mg Intravenous Daily   Continuous Infusions: . sodium chloride 10 mL/hr at 04/20/19 1720  . remdesivir 100 mg in NS 250 mL 100 mg (04/20/19 2258)   PRN Meds:.acetaminophen, alum & mag hydroxide-simeth, bisacodyl, ondansetron **OR** ondansetron (ZOFRAN) IV, oxyCODONE, polyethylene glycol  Antibiotics  :    Anti-infectives  (From admission, onward)   Start     Dose/Rate Route Frequency Ordered Stop   04/20/19 2300  remdesivir 100 mg in sodium chloride 0.9 % 230 mL IVPB     100 mg 500 mL/hr over 30 Minutes Intravenous Every 24 hours 04/19/19 2146 04/24/19 2259   04/19/19 2300  remdesivir 200 mg in sodium chloride 0.9 % 210 mL IVPB     200 mg 500 mL/hr over 30 Minutes Intravenous Once 04/19/19 2146 04/19/19 2259   04/19/19 1530  cefTRIAXone (ROCEPHIN) 1 g in sodium chloride 0.9 % 100 mL IVPB     1 g 200 mL/hr over 30 Minutes Intravenous  Once 04/19/19 1519 04/19/19 1733   04/19/19 1530  azithromycin (ZITHROMAX) 500 mg in sodium chloride 0.9 % 250 mL IVPB     500 mg 250 mL/hr over 60 Minutes Intravenous  Once 04/19/19 1519 04/19/19 1733       Time Spent in minutes  30   Lala Lund M.D on 04/21/2019 at 9:32 AM  To page go to [email or web address] - password [facility]  [facility] -  Office  [phone]   See all Orders from today for further details    Objective:   Vitals:   04/20/19 2000 04/20/19 2300 04/21/19 0000 04/21/19 0400  BP: 112/68  98/68 112/72  Pulse: 81 73 76 79  Resp: 19     Temp: 98.3 F (36.8 C)  98.7 F (37.1 C) 99.2 F (37.3 C)  TempSrc: Oral  Oral Oral  SpO2: 93% 91% 91% 91%  Weight:      Height:        Wt Readings from Last 3 Encounters:  04/19/19 74.8 kg  12/24/15 74.4 kg  12/22/15 73.5 kg     Intake/Output Summary (Last 24 hours) at 04/21/2019 0932 Last data filed at 04/20/2019 1800 Gross per 24 hour  Intake 110 ml  Output -  Net 110 ml     Physical Exam  Awake Alert, Oriented X 3, No new F.N deficits, Normal affect [place].AT,PERRAL Supple Neck,No JVD, No cervical lymphadenopathy appriciated.  Symmetrical Chest wall movement, Good air movement bilaterally, CTAB RRR,No Gallops, Rubs or new Murmurs, No Parasternal Heave +ve B.Sounds, Abd Soft, No tenderness, No organomegaly appriciated, No rebound - guarding or rigidity. No Cyanosis, Clubbing or edema, No new  Rash or bruise   Data Review:    CBC Recent Labs  Lab 04/19/19 1352 04/20/19 0500 04/21/19 0430  WBC 6.7 5.7 7.6  HGB 15.9 15.9 15.7  HCT 47.0 48.6 47.4  PLT 182 140* 246  MCV 89.9 92.4 91.7  MCH 30.4 30.2 30.4  MCHC 33.8 32.7 33.1  RDW 12.3 12.3 12.7  LYMPHSABS 1.1 0.6* 1.3  MONOABS 0.5 0.2 0.5  EOSABS 0.0 0.0 0.1  BASOSABS 0.0 0.0 0.0    Chemistries  Recent Labs  Lab 04/19/19 1352 04/19/19 2144 04/20/19 0500 04/21/19 0430  NA 135  --  138 139  K 4.1  --  4.4 4.3  CL 100  --  102 105  CO2 23  --  24 24  GLUCOSE 196*  --  235* 201*  BUN 11  --  18 22*  CREATININE 0.79  --  0.70 0.71  CALCIUM 8.5*  --  8.4* 8.2*  MG  --   --  2.1 2.1  AST  --  _0 ALT  --  _1 ALKPHOS  --  66 68 65  BILITOT  --  0.9 0.4 0.3   ------------------------------------------------------------------------------------------------------------------ No results for input(s): CHOL, HDL, LDLCALC, TRIG, CHOLHDL, LDLDIRECT in the last 72 hours.  Lab Results  Component Value Date   HGBA1C 12.6 (H) 04/19/2019   ------------------------------------------------------------------------------------------------------------------ No results for input(s): TSH, T4TOTAL, T3FREE, THYROIDAB in the last 72 hours.  Invalid input(s): FREET3  Cardiac Enzymes No results for input(s): CKMB, TROPONINI, MYOGLOBIN in the last 168 hours.  Invalid input(s): CK ------------------------------------------------------------------------------------------------------------------    Component Value Date/Time   BNP 28.7 04/19/2019 2039    Micro Results Recent Results (from the past 240 hour(s))  Culture, blood (routine x 2)     Status: None (Preliminary result)   Collection Time: 04/19/19  2:00 PM   Specimen: BLOOD RIGHT FOREARM  Result Value Ref Range Status   Specimen Description BLOOD RIGHT FOREARM  Final   Special Requests   Final    BOTTLES DRAWN AEROBIC AND ANAEROBIC Blood Culture  adequate volume   Culture   Final    NO GROWTH 1 DAY Performed at [facility], [place] [address] [place], [place] [address]    Report Status PENDING  Incomplete  Culture, blood (routine x 2)     Status: None (Preliminary result)   Collection Time: 04/19/19  2:15 PM   Specimen: BLOOD  Result Value Ref Range Status  Specimen Description BLOOD LEFT ANTECUBITAL  Final   Special Requests   Final    BOTTLES DRAWN AEROBIC AND ANAEROBIC Blood Culture adequate volume   Culture   Final    NO GROWTH 1 DAY Performed at [facility], [address], [place], [place] [address]    Report Status PENDING  Incomplete  SARS Coronavirus 2     Status: Abnormal   Collection Time: 04/19/19  2:53 PM  Result Value Ref Range Status   SARS Coronavirus 2 DETECTED (A) NOT DETECTED Final    Comment: CRITICAL RESULT CALLED TO, READ BACK BY AND VERIFIED WITH: Revonda Humphrey, ED RN AT 1630 ON 04/19/19 BY C. JESSUP, MLT. (NOTE) SARS-CoV-2 target nucleic acids are DETECTED. The SARS-CoV-2 RNA is generally detectable in upper and lower respiratory specimens during the acute phase of infection. Positive results are indicative of active infection with SARS-CoV-2. Clinical  correlation with Brian Rivas history and other diagnostic information is necessary to determine Brian Rivas infection status. Positive results do  not rule out bacterial infection or co-infection with other viruses. The expected result is Not Detected. Fact Sheet for Patients: http://www.biofiredefense.com/wp-content/uploads/2020/03/BIOFIRE-COVID -19-patients.pdf Fact Sheet for Healthcare Providers: http://www.biofiredefense.com/wp-content/uploads/2020/03/BIOFIRE-COVID -19-hcp.pdf This test is not yet approved or cleared by the [place] and  has been authorized for detection and/or diagnosis of SARS-CoV-2 by  FDA under an Emergency Use Authorization (EUA).  This EUA will remain in effect (meaning this test can be used) for the duration  of  the COVID-19 declaration under Section 564(b)(1) of the Act, 21 U.S.C. section [phone] 3(b)(1), unless the authorization is terminated or revoked sooner. Performed at [facility], [place] [address], [place],  [address]     Radiology Reports Dg Chest Portable 1 View  Result Date: 04/19/2019 CLINICAL DATA:  Cough since yesterday with chest pain.  Fever. EXAM: PORTABLE CHEST 1 VIEW COMPARISON:  None. FINDINGS: Indistinct airspace opacity in the left mid lung. Indistinct linear opacities at the lung bases. Low lung volumes.  Upper normal heart size.  No pleural effusion. IMPRESSION: 1. Indistinct airspace opacity in the left mid lung with questionable linear opacities at the lung bases. Possibilities might include bacterial pneumonia, atypical pneumonia, or aspiration pneumonitis. Electronically Signed   By: Van Clines M.D.   On: 04/19/2019 14:39

## 2019-04-21 NOTE — Plan of Care (Signed)

## 2019-04-21 NOTE — Progress Notes (Deleted)
PROGRESS NOTE                                                                                                                                                                                                             Brian Rivas Demographics:    Brian Rivas, is a 48 y.o. male, DOB - 09-Jun-1971, BTD:[id number]  Outpatient Primary MD for the Brian Rivas is Brian Rivas, No Pcp Per    LOS - 2  Admit date - 04/19/2019    Chief Complaint  Brian Rivas presents with  . Headache  . Shortness of Breath       Brief Narrative  Brian Rivas is a 48 y.o. male with medical history significant of DM and HLD presenting with SOB.  He reports onset of a frontal headache on Wednesday.  He has had mild SOB.  + cough.  + fever.  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 In the ER -   SOB, Tachypnea to 30s, multifocal PNA on CXR.  COVID +.  O2 90-93% on RA.   Subjective:   Brian Rivas in bed denies any headache chest pain or belly pain, shortness of breath is minimal and improving.   Assessment  & Plan :     1. Acute Hypoxic Resp. Failure due to Acute Covid 19 Viral Illness during the ongoing 2020 Covid 19 Pandemic - with mild hypoxia, positive chest x-ray and borderline elevated inflammatory markers.  He has been placed on IV steroids and IV REMDESIVIR, clinically improving inflammatory markers trending down continue and monitor.  Encouraged to sit up in chair use flutter valve and I-S for pulmonary toiletry, titrate down oxygen.    COVID-19 Labs  Recent Labs    04/19/19 2039 04/20/19 0500 04/21/19 0430  DDIMER 1.40* 1.18* 0.87*  FERRITIN 283 335  --   LDH 274*  --   --   CRP 8.4* 11.2* 7.5*    Lab Results  Component Value Date   SARSCOV2NAA DETECTED (A) 04/19/2019     Hepatic Function Latest Ref Rng & Units 04/21/2019 04/20/2019 04/19/2019  Total Protein 6.5 - 8.1 g/dL 6.5 7.1 6.7  Albumin 3.5 - 5.0  g/dL 2.9(L) 3.1(L) 2.9(L)  AST 15 - 41 U/L _0 ALT 0 - 44 U/L _1 Alk Phosphatase 38 - 126  U/L 65 68 66  Total Bilirubin 0.3 - 1.2 mg/dL 0.3 0.4 0.9  Bilirubin, Direct 0.0 - 0.2 mg/dL - - 0.2        Component Value Date/Time   BNP 28.7 04/19/2019 2039      2.  Dyslipidemia.  Continue home dose Lipitor.   3.  DM type II.  Poor outpatient control due to hyperglycemia placed on Lantus sliding scale, DM and insulin education this admission.  Lantus dose adjusted and pre-meal NovoLog added for better control.  CBG (last 3)  Recent Labs    04/20/19 1157 04/20/19 1632 04/20/19 2041  GLUCAP 313* 375* 240*   Lab Results  Component Value Date   HGBA1C 12.6 (H) 04/19/2019      Condition - Extremely Guarded  Family Communication  :  None  Code Status :  Full  Diet :   Diet Order            Diet Carb Modified Fluid consistency: Thin; Room service appropriate? Yes  Diet effective now               Disposition Plan  :  Home   Consults  :  None  Procedures  :  None  PUD Prophylaxis :   DVT Prophylaxis  :  Lovenox   Lab Results  Component Value Date   PLT 246 04/21/2019    Inpatient Medications  Scheduled Meds: . atorvastatin  10 mg Oral Daily  . docusate sodium  100 mg Oral BID  . enoxaparin (LOVENOX) injection  40 mg Subcutaneous Q24H  . insulin aspart  0-15 Units Subcutaneous TID WC  . insulin aspart  0-5 Units Subcutaneous QHS  . insulin glargine  15 Units Subcutaneous Daily  . methylPREDNISolone (SOLU-MEDROL) injection  60 mg Intravenous Daily   Continuous Infusions: . sodium chloride 10 mL/hr at 04/20/19 1720  . remdesivir 100 mg in NS 250 mL 100 mg (04/20/19 2258)   PRN Meds:.acetaminophen, alum & mag hydroxide-simeth, bisacodyl, ondansetron **OR** ondansetron (ZOFRAN) IV, oxyCODONE, polyethylene glycol  Antibiotics  :    Anti-infectives (From admission, onward)   Start     Dose/Rate Route Frequency Ordered Stop   04/20/19 2300   remdesivir 100 mg in sodium chloride 0.9 % 230 mL IVPB     100 mg 500 mL/hr over 30 Minutes Intravenous Every 24 hours 04/19/19 2146 04/24/19 2259   04/19/19 2300  remdesivir 200 mg in sodium chloride 0.9 % 210 mL IVPB     200 mg 500 mL/hr over 30 Minutes Intravenous Once 04/19/19 2146 04/19/19 2259   04/19/19 1530  cefTRIAXone (ROCEPHIN) 1 g in sodium chloride 0.9 % 100 mL IVPB     1 g 200 mL/hr over 30 Minutes Intravenous  Once 04/19/19 1519 04/19/19 1733   04/19/19 1530  azithromycin (ZITHROMAX) 500 mg in sodium chloride 0.9 % 250 mL IVPB     500 mg 250 mL/hr over 60 Minutes Intravenous  Once 04/19/19 1519 04/19/19 1733       Time Spent in minutes  30   Lala Lund M.D on 04/21/2019 at 8:33 AM  To page go to [email or web address] - password [facility]  [facility] -  Office  [phone]   See all Orders from today for further details    Objective:   Vitals:   04/20/19 2000 04/20/19 2300 04/21/19 0000 04/21/19 0400  BP: 112/68  98/68 112/72  Pulse: 81 73 76 79  Resp: 19     Temp: 98.3 F (  36.8 C)  98.7 F (37.1 C) 99.2 F (37.3 C)  TempSrc: Oral  Oral Oral  SpO2: 93% 91% 91% 91%  Weight:      Height:        Wt Readings from Last 3 Encounters:  04/19/19 74.8 kg  12/24/15 74.4 kg  12/22/15 73.5 kg     Intake/Output Summary (Last 24 hours) at 04/21/2019 0833 Last data filed at 04/20/2019 1800 Gross per 24 hour  Intake 110 ml  Output -  Net 110 ml     Physical Exam  Awake Alert, Oriented X 3, No new F.N deficits, Normal affect [name].AT,PERRAL Supple Neck,No JVD, No cervical lymphadenopathy appriciated.  Symmetrical Chest wall movement, Good air movement bilaterally, CTAB RRR,No Gallops, Rubs or new Murmurs, No Parasternal Heave +ve B.Sounds, Abd Soft, No tenderness, No organomegaly appriciated, No rebound - guarding or rigidity. No Cyanosis, Clubbing or edema, No new Rash or bruise    Data Review:    CBC Recent Labs  Lab 04/19/19 1352 04/20/19 0500  04/21/19 0430  WBC 6.7 5.7 7.6  HGB 15.9 15.9 15.7  HCT 47.0 48.6 47.4  PLT 182 140* 246  MCV 89.9 92.4 91.7  MCH 30.4 30.2 30.4  MCHC 33.8 32.7 33.1  RDW 12.3 12.3 12.7  LYMPHSABS 1.1 0.6* 1.3  MONOABS 0.5 0.2 0.5  EOSABS 0.0 0.0 0.1  BASOSABS 0.0 0.0 0.0    Chemistries  Recent Labs  Lab 04/19/19 1352 04/19/19 2144 04/20/19 0500 04/21/19 0430  NA 135  --  138 139  K 4.1  --  4.4 4.3  CL 100  --  102 105  CO2 23  --  24 24  GLUCOSE 196*  --  235* 201*  BUN 11  --  18 22*  CREATININE 0.79  --  0.70 0.71  CALCIUM 8.5*  --  8.4* 8.2*  MG  --   --  2.1 2.1  AST  --  _0 ALT  --  _1 ALKPHOS  --  66 68 65  BILITOT  --  0.9 0.4 0.3   ------------------------------------------------------------------------------------------------------------------ No results for input(s): CHOL, HDL, LDLCALC, TRIG, CHOLHDL, LDLDIRECT in the last 72 hours.  Lab Results  Component Value Date   HGBA1C 12.6 (H) 04/19/2019   ------------------------------------------------------------------------------------------------------------------ No results for input(s): TSH, T4TOTAL, T3FREE, THYROIDAB in the last 72 hours.  Invalid input(s): FREET3  Cardiac Enzymes No results for input(s): CKMB, TROPONINI, MYOGLOBIN in the last 168 hours.  Invalid input(s): CK ------------------------------------------------------------------------------------------------------------------    Component Value Date/Time   BNP 28.7 04/19/2019 2039    Micro Results Recent Results (from the past 240 hour(s))  Culture, blood (routine x 2)     Status: None (Preliminary result)   Collection Time: 04/19/19  2:00 PM   Specimen: BLOOD RIGHT FOREARM  Result Value Ref Range Status   Specimen Description BLOOD RIGHT FOREARM  Final   Special Requests   Final    BOTTLES DRAWN AEROBIC AND ANAEROBIC Blood Culture adequate volume   Culture   Final    NO GROWTH 1 DAY Performed at [facility], [place] [address] [place], [place] [address]    Report Status PENDING  Incomplete  Culture, blood (routine x 2)     Status: None (Preliminary result)   Collection Time: 04/19/19  2:15 PM   Specimen: BLOOD  Result Value Ref Range Status   Specimen Description BLOOD LEFT ANTECUBITAL  Final   Special Requests  Final    BOTTLES DRAWN AEROBIC AND ANAEROBIC Blood Culture adequate volume   Culture   Final    NO GROWTH 1 DAY Performed at [facility], [place] [address] [place], [place] [address]    Report Status PENDING  Incomplete  SARS Coronavirus 2     Status: Abnormal   Collection Time: 04/19/19  2:53 PM  Result Value Ref Range Status   SARS Coronavirus 2 DETECTED (A) NOT DETECTED Final    Comment: CRITICAL RESULT CALLED TO, READ BACK BY AND VERIFIED WITH: Revonda Humphrey, ED RN AT 1630 ON 04/19/19 BY C. JESSUP, MLT. (NOTE) SARS-CoV-2 target nucleic acids are DETECTED. The SARS-CoV-2 RNA is generally detectable in upper and lower respiratory specimens during the acute phase of infection. Positive results are indicative of active infection with SARS-CoV-2. Clinical  correlation with Brian Rivas history and other diagnostic information is necessary to determine Brian Rivas infection status. Positive results do  not rule out bacterial infection or co-infection with other viruses. The expected result is Not Detected. Fact Sheet for Patients: http://www.biofiredefense.com/wp-content/uploads/2020/03/BIOFIRE-COVID -19-patients.pdf Fact Sheet for Healthcare Providers: http://www.biofiredefense.com/wp-content/uploads/2020/03/BIOFIRE-COVID -19-hcp.pdf This test is not yet approved or cleared by the [place] and  has been authorized for detection and/or diagnosis of SARS-CoV-2 by  FDA under an Emergency Use Authorization (EUA).  This EUA will remain in effect (meaning this test can be used) for the duration of  the COVID-19 declaration under Section 564(b)(1) of the Act, 21 U.S.C. section [phone]  3(b)(1), unless the authorization is terminated or revoked sooner. Performed at [facility], [place] [address] [place], [place] [address]     Radiology Reports Dg Chest Portable 1 View  Result Date: 04/19/2019 CLINICAL DATA:  Cough since yesterday with chest pain.  Fever. EXAM: PORTABLE CHEST 1 VIEW COMPARISON:  None. FINDINGS: Indistinct airspace opacity in the left mid lung. Indistinct linear opacities at the lung bases. Low lung volumes.  Upper normal heart size.  No pleural effusion. IMPRESSION: 1. Indistinct airspace opacity in the left mid lung with questionable linear opacities at the lung bases. Possibilities might include bacterial pneumonia, atypical pneumonia, or aspiration pneumonitis. Electronically Signed   By: Van Clines M.D.   On: 04/19/2019 14:39

## 2019-04-22 LAB — COMPREHENSIVE METABOLIC PANEL
ALT: 20 U/L (ref 0–44)
AST: 20 U/L (ref 15–41)
Albumin: 2.9 g/dL — ABNORMAL LOW (ref 3.5–5.0)
Alkaline Phosphatase: 65 U/L (ref 38–126)
Anion gap: 12 (ref 5–15)
BUN: 25 mg/dL — ABNORMAL HIGH (ref 6–20)
CO2: 24 mmol/L (ref 22–32)
Calcium: 8.3 mg/dL — ABNORMAL LOW (ref 8.9–10.3)
Chloride: 104 mmol/L (ref 98–111)
Creatinine, Ser: 0.62 mg/dL (ref 0.61–1.24)
GFR calc Af Amer: >60 mL/min (ref >60)
GFR calc non Af Amer: >60 mL/min (ref >60)
Glucose, Bld: 190 mg/dL — ABNORMAL HIGH (ref 70–99)
Potassium: 4.3 mmol/L (ref 3.5–5.1)
Sodium: 140 mmol/L (ref 135–145)
Total Bilirubin: 0.3 mg/dL (ref 0.3–1.2)
Total Protein: 6.5 g/dL (ref 6.5–8.1)

## 2019-04-22 LAB — CBC WITH DIFFERENTIAL/PLATELET
Abs Immature Granulocytes: 0.03 10*3/uL (ref 0.00–0.07)
Basophils Absolute: 0 10*3/uL (ref 0.0–0.1)
Basophils Relative: 0 %
Eosinophils Absolute: 0 10*3/uL (ref 0.0–0.5)
Eosinophils Relative: 0 %
HCT: 46.9 % (ref 39.0–52.0)
Hemoglobin: 15.1 g/dL (ref 13.0–17.0)
Immature Granulocytes: 1 %
Lymphocytes Relative: 21 %
Lymphs Abs: 1.1 10*3/uL (ref 0.7–4.0)
MCH: 29.6 pg (ref 26.0–34.0)
MCHC: 32.2 g/dL (ref 30.0–36.0)
MCV: 92 fL (ref 80.0–100.0)
Monocytes Absolute: 0.5 10*3/uL (ref 0.1–1.0)
Monocytes Relative: 10 %
Neutro Abs: 3.7 10*3/uL (ref 1.7–7.7)
Neutrophils Relative %: 68 %
Platelets: 265 10*3/uL (ref 150–400)
RBC: 5.1 MIL/uL (ref 4.22–5.81)
RDW: 12.6 % (ref 11.5–15.5)
WBC: 5.3 10*3/uL (ref 4.0–10.5)
nRBC: 0 % (ref 0.0–0.2)

## 2019-04-22 LAB — PHOSPHORUS: Phosphorus: 4 mg/dL (ref 2.5–4.6)

## 2019-04-22 LAB — C-REACTIVE PROTEIN: CRP: 5.8 mg/dL — ABNORMAL HIGH (ref <1.0)

## 2019-04-22 LAB — FERRITIN: Ferritin: 487 ng/mL — ABNORMAL HIGH (ref 24–336)

## 2019-04-22 LAB — MAGNESIUM: Magnesium: 2.5 mg/dL — ABNORMAL HIGH (ref 1.7–2.4)

## 2019-04-22 LAB — D-DIMER, QUANTITATIVE: D-Dimer, Quant: 0.97 ug/mL-FEU — ABNORMAL HIGH (ref 0.00–0.50)

## 2019-04-22 LAB — GLUCOSE, CAPILLARY
Glucose-Capillary: 239 mg/dL — ABNORMAL HIGH (ref 70–99)
Glucose-Capillary: 247 mg/dL — ABNORMAL HIGH (ref 70–99)
Glucose-Capillary: 295 mg/dL — ABNORMAL HIGH (ref 70–99)
Glucose-Capillary: 351 mg/dL — ABNORMAL HIGH (ref 70–99)

## 2019-04-22 MED ORDER — FUROSEMIDE 10 MG/ML IJ SOLN: 40.0000 mg | Freq: Once | INTRAMUSCULAR | Status: DC

## 2019-04-22 MED ORDER — INSULIN GLARGINE 100 UNIT/ML ~~LOC~~ SOLN
25.0000 [IU] | Freq: Every day | SUBCUTANEOUS | Status: DC
  Administered 2019-04-23: 25 [IU] via SUBCUTANEOUS
  Filled 2019-04-22: qty 0.25

## 2019-04-22 NOTE — Progress Notes (Signed)
Inpatient Diabetes Program Recommendations  AACE/ADA: New Consensus Statement on Inpatient Glycemic Control (2015)  Target Ranges:  Prepandial:   less than 140 mg/dL      Peak postprandial:   less than 180 mg/dL (1-2 hours)      Critically ill patients:  140 - 180 mg/dL   Lab Results  Component Value Date   GLUCAP 247 (H) 04/22/2019   HGBA1C 12.6 (H) 04/19/2019    Review of Glycemic Control  Spoke with pt on phone today regarding his HgbA1C of 12.6% and importance of getting his blood sugars in better control. Pt sees PCP at [facility]. Appears to have previously been on metformin 1000 mg bid, Jardiance 12.5 mg QD, Humalog 10 units SQ (has listed as stopped on 08/22/18) Pt also states he has never checked his blood sugar at home. Does not have a meter. There was a little communication language barrier and this Coordinator has requested RN to call [employer] for interpreter. Also requested RN to allow pt to give his own insulin injections and stick finger for CBGs. Has LWWD book in [place] and states he did not have any questions. Want to further explore why blood sugar control is important, monitoring on a daily basis, lifestyle modification with diet, exercise and stress management, and f/u with PCP. Goal is to reduce risk of long and short term complications from uncontrolled blood sugars.   Blood sugars 239, 247 today.  Steroids decreasing - Solumedrol 60 mg QD now  Inpatient Diabetes Program Recommendations:     Lantus increased to 25 units QD Increase Novolog to 6 units tidwc for meal coverage insulin. Will order OP Diabetes Education consult for uncontrolled DM.  Will await call from [employer] interpreter to continue speaking with pt.  Will follow daily.  Thank you. Lorenda Peck, RD, LDN, CDE Inpatient Diabetes Coordinator [phone]

## 2019-04-22 NOTE — Progress Notes (Signed)
PROGRESS NOTE                                                                                                                                                                                                             Patient Demographics:    Brian Rivas, is a 48 y.o. male, DOB - [birth year], IYJ:[id number]  Outpatient Primary MD for the patient is Patient, No Pcp Per    LOS - 3  Admit date - 04/19/2019    Chief Complaint  Patient presents with  . Headache  . Shortness of Breath       Brief Narrative  Brian Rivas is a 48 y.o. male with medical history significant of DM and HLD presenting with SOB.  He reports onset of a frontal headache on Wednesday.  He has had mild SOB.  + cough.  + fever.  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 In the ER -   SOB, Tachypnea to 30s, multifocal PNA on CXR.  COVID +.  O2 90-93% on RA.   Subjective:   Patient in bed, appears comfortable, denies any headache, no fever, no chest pain or pressure, no shortness of breath , no abdominal pain. No focal weakness.   Assessment  & Plan :     1. Acute Hypoxic Resp. Failure due to Acute Covid 19 Viral Illness during the ongoing 2020 Covid 19 Pandemic - with mild hypoxia, positive chest x-ray and borderline elevated inflammatory markers.  He has been placed on IV steroids and IV REMDESIVIR, clinically improving inflammatory markers trending down continue and monitor.  Encouraged to sit up in chair use flutter valve and I -S for pulmonary toiletry, titrate down oxygen, improving.    COVID-19 Labs  Recent Labs    04/19/19 2039 04/20/19 0500 04/21/19 0430 04/22/19 0315  DDIMER 1.40* 1.18* 0.87* 0.97*  FERRITIN 283 335 423* 487*  LDH 274*  --   --   --   CRP 8.4* 11.2* 7.5* 5.8*    Lab Results  Component Value Date   SARSCOV2NAA DETECTED (A) 04/19/2019     Hepatic Function Latest Ref Rng &  Units 04/22/2019 04/21/2019 04/20/2019  Total Protein 6.5 - 8.1 g/dL 6.5 6.5 7.1  Albumin 3.5 - 5.0 g/dL 2.9(L) 2.9(L) 3.1(L)  AST 15 - 41 U/L 20 21  25  ALT 0 - 44 U/L _0 Alk Phosphatase 38 - 126 U/L 65 65 68  Total Bilirubin 0.3 - 1.2 mg/dL 0.3 0.3 0.4  Bilirubin, Direct 0.0 - 0.2 mg/dL - - -        Component Value Date/Time   BNP 28.7 04/19/2019 2039      2.  Dyslipidemia.  Continue home dose Lipitor.   3.  DM type II.  Poor outpatient control due to hyperglycemia placed on Lantus sliding scale, DM and insulin education ordered this admission.  Lantus dose adjusted and pre-meal NovoLog added for better control again on 04/22/19.  CBG (last 3)  Recent Labs    04/21/19 1617 04/21/19 2138 04/22/19 0756  GLUCAP 199* 234* 239*   Lab Results  Component Value Date   HGBA1C 12.6 (H) 04/19/2019      Condition - Extremely Guarded  Family Communication  :  None  Code Status :  Full  Diet :   Diet Order            Diet Carb Modified Fluid consistency: Thin; Room service appropriate? Yes  Diet effective now               Disposition Plan  :  Home   Consults  :  None  Procedures  :  None  PUD Prophylaxis :   DVT Prophylaxis  :  Lovenox   Lab Results  Component Value Date   PLT 265 04/22/2019    Inpatient Medications  Scheduled Meds: . atorvastatin  10 mg Oral Daily  . docusate sodium  100 mg Oral BID  . enoxaparin (LOVENOX) injection  40 mg Subcutaneous Q24H  . insulin aspart  0-15 Units Subcutaneous TID WC  . insulin aspart  0-5 Units Subcutaneous QHS  . insulin aspart  4 Units Subcutaneous TID WC  . insulin glargine  20 Units Subcutaneous Daily  . methylPREDNISolone (SOLU-MEDROL) injection  60 mg Intravenous Daily   Continuous Infusions: . remdesivir 100 mg in NS 250 mL Stopped (04/21/19 2340)   PRN Meds:.acetaminophen, alum & mag hydroxide-simeth, bisacodyl, ondansetron **OR** ondansetron (ZOFRAN) IV, oxyCODONE, polyethylene glycol   Antibiotics  :    Anti-infectives (From admission, onward)   Start     Dose/Rate Route Frequency Ordered Stop   04/20/19 2300  remdesivir 100 mg in sodium chloride 0.9 % 230 mL IVPB     100 mg 500 mL/hr over 30 Minutes Intravenous Every 24 hours 04/19/19 2146 04/24/19 2259   04/19/19 2300  remdesivir 200 mg in sodium chloride 0.9 % 210 mL IVPB     200 mg 500 mL/hr over 30 Minutes Intravenous Once 04/19/19 2146 04/19/19 2259   04/19/19 1530  cefTRIAXone (ROCEPHIN) 1 g in sodium chloride 0.9 % 100 mL IVPB     1 g 200 mL/hr over 30 Minutes Intravenous  Once 04/19/19 1519 04/19/19 1733   04/19/19 1530  azithromycin (ZITHROMAX) 500 mg in sodium chloride 0.9 % 250 mL IVPB     500 mg 250 mL/hr over 60 Minutes Intravenous  Once 04/19/19 1519 04/19/19 1733       Time Spent in minutes  30   Lala Lund M.D on 04/22/2019 at 9:35 AM  To page go to [email or web address] - password [facility]  [facility] -  Office  [phone]   See all Orders from today for further details    Objective:   Vitals:   04/21/19 0000 04/21/19 0400 04/21/19 1256 04/22/19 0800  BP:  98/68 112/72    Pulse: 76 79    Resp:      Temp: 98.7 F (37.1 C) 99.2 F (37.3 C) (!) 97.2 F (36.2 C) 98.4 F (36.9 C)  TempSrc: Oral Oral Oral   SpO2: 91% 91%    Weight:      Height:        Wt Readings from Last 3 Encounters:  04/19/19 74.8 kg  12/24/15 74.4 kg  12/22/15 73.5 kg     Intake/Output Summary (Last 24 hours) at 04/22/2019 0935 Last data filed at 04/21/2019 1200 Gross per 24 hour  Intake 120 ml  Output -  Net 120 ml     Physical Exam  Awake Alert, Oriented X 3, No new F.N deficits, Normal affect Stallings.AT,PERRAL Supple Neck,No JVD, No cervical lymphadenopathy appriciated.  Symmetrical Chest wall movement, Good air movement bilaterally, few rales RRR,No Gallops, Rubs or new Murmurs, No Parasternal Heave +ve B.Sounds, Abd Soft, No tenderness, No organomegaly appriciated, No rebound - guarding or  rigidity. No Cyanosis, Clubbing or edema, No new Rash or bruise    Data Review:    CBC Recent Labs  Lab 04/19/19 1352 04/20/19 0500 04/21/19 0430 04/22/19 0315  WBC 6.7 5.7 7.6 5.3  HGB 15.9 15.9 15.7 15.1  HCT 47.0 48.6 47.4 46.9  PLT 182 140* 246 265  MCV 89.9 92.4 91.7 92.0  MCH 30.4 30.2 30.4 29.6  MCHC 33.8 32.7 33.1 32.2  RDW 12.3 12.3 12.7 12.6  LYMPHSABS 1.1 0.6* 1.3 1.1  MONOABS 0.5 0.2 0.5 0.5  EOSABS 0.0 0.0 0.1 0.0  BASOSABS 0.0 0.0 0.0 0.0    Chemistries  Recent Labs  Lab 04/19/19 1352 04/19/19 2144 04/20/19 0500 04/21/19 0430 04/22/19 0315  NA 135  --  138 139 140  K 4.1  --  4.4 4.3 4.3  CL 100  --  102 105 104  CO2 23  --  _0 GLUCOSE 196*  --  235* 201* 190*  BUN 11  --  18 22* 25*  CREATININE 0.79  --  0.70 0.71 0.62  CALCIUM 8.5*  --  8.4* 8.2* 8.3*  MG  --   --  2.1 2.1 2.5*  AST  --  _1 ALT  --  _2 ALKPHOS  --  66 68 65 65  BILITOT  --  0.9 0.4 0.3 0.3   ------------------------------------------------------------------------------------------------------------------ No results for input(s): CHOL, HDL, LDLCALC, TRIG, CHOLHDL, LDLDIRECT in the last 72 hours.  Lab Results  Component Value Date   HGBA1C 12.6 (H) 04/19/2019   ------------------------------------------------------------------------------------------------------------------ No results for input(s): TSH, T4TOTAL, T3FREE, THYROIDAB in the last 72 hours.  Invalid input(s): FREET3  Cardiac Enzymes No results for input(s): CKMB, TROPONINI, MYOGLOBIN in the last 168 hours.  Invalid input(s): CK ------------------------------------------------------------------------------------------------------------------    Component Value Date/Time   BNP 28.7 04/19/2019 2039    Micro Results Recent Results (from the past 240 hour(s))  Culture, blood (routine x 2)     Status: None (Preliminary result)   Collection Time: 04/19/19  2:00 PM   Specimen:  BLOOD RIGHT FOREARM  Result Value Ref Range Status   Specimen Description BLOOD RIGHT FOREARM  Final   Special Requests   Final    BOTTLES DRAWN AEROBIC AND ANAEROBIC Blood Culture adequate volume   Culture   Final    NO GROWTH 1 DAY Performed at [facility], [place] [address], [place], [place] [address]  Report Status PENDING  Incomplete  Culture, blood (routine x 2)     Status: None (Preliminary result)   Collection Time: 04/19/19  2:15 PM   Specimen: BLOOD  Result Value Ref Range Status   Specimen Description BLOOD LEFT ANTECUBITAL  Final   Special Requests   Final    BOTTLES DRAWN AEROBIC AND ANAEROBIC Blood Culture adequate volume   Culture   Final    NO GROWTH 1 DAY Performed at [facility], [address], [place], [place] [address]    Report Status PENDING  Incomplete  SARS Coronavirus 2     Status: Abnormal   Collection Time: 04/19/19  2:53 PM  Result Value Ref Range Status   SARS Coronavirus 2 DETECTED (A) NOT DETECTED Final    Comment: CRITICAL RESULT CALLED TO, READ BACK BY AND VERIFIED WITH: Revonda Humphrey, ED RN AT 1630 ON 04/19/19 BY C. JESSUP, MLT. (NOTE) SARS-CoV-2 target nucleic acids are DETECTED. The SARS-CoV-2 RNA is generally detectable in upper and lower respiratory specimens during the acute phase of infection. Positive results are indicative of active infection with SARS-CoV-2. Clinical  correlation with patient history and other diagnostic information is necessary to determine patient infection status. Positive results do  not rule out bacterial infection or co-infection with other viruses. The expected result is Not Detected. Fact Sheet for Patients: http://www.biofiredefense.com/wp-content/uploads/2020/03/BIOFIRE-COVID -19-patients.pdf Fact Sheet for Healthcare Providers: http://www.biofiredefense.com/wp-content/uploads/2020/03/BIOFIRE-COVID -19-hcp.pdf This test is not yet approved or cleared by the [place] and  has been  authorized for detection and/or diagnosis of SARS-CoV-2 by  FDA under an Emergency Use Authorization (EUA).  This EUA will remain in effect (meaning this test can be used) for the duration of  the COVID-19 declaration under Section 564(b)(1) of the Act, 21 U.S.C. section [phone] 3(b)(1), unless the authorization is terminated or revoked sooner. Performed at [facility], [place] [address], [place], [place] [address]     Radiology Reports Dg Chest Portable 1 View  Result Date: 04/19/2019 CLINICAL DATA:  Cough since yesterday with chest pain.  Fever. EXAM: PORTABLE CHEST 1 VIEW COMPARISON:  None. FINDINGS: Indistinct airspace opacity in the left mid lung. Indistinct linear opacities at the lung bases. Low lung volumes.  Upper normal heart size.  No pleural effusion. IMPRESSION: 1. Indistinct airspace opacity in the left mid lung with questionable linear opacities at the lung bases. Possibilities might include bacterial pneumonia, atypical pneumonia, or aspiration pneumonitis. Electronically Signed   By: Van Clines M.D.   On: 04/19/2019 14:39

## 2019-04-22 NOTE — Progress Notes (Signed)
Spoke to patient and asked if he wanted me to call his family members to update them on his condition. He requested that I not call them and update. He stated that he would give them updates on his condition and how he felt.  I will respect patient's request and I will not make contact with the family.

## 2019-04-23 LAB — CBC WITH DIFFERENTIAL/PLATELET
Abs Immature Granulocytes: 0.05 10*3/uL (ref 0.00–0.07)
Basophils Absolute: 0 10*3/uL (ref 0.0–0.1)
Basophils Relative: 1 %
Eosinophils Absolute: 0 10*3/uL (ref 0.0–0.5)
Eosinophils Relative: 0 %
HCT: 44.7 % (ref 39.0–52.0)
Hemoglobin: 15 g/dL (ref 13.0–17.0)
Immature Granulocytes: 1 %
Lymphocytes Relative: 23 %
Lymphs Abs: 1.7 10*3/uL (ref 0.7–4.0)
MCH: 30.5 pg (ref 26.0–34.0)
MCHC: 33.6 g/dL (ref 30.0–36.0)
MCV: 91 fL (ref 80.0–100.0)
Monocytes Absolute: 0.7 10*3/uL (ref 0.1–1.0)
Monocytes Relative: 10 %
Neutro Abs: 4.6 10*3/uL (ref 1.7–7.7)
Neutrophils Relative %: 65 %
Platelets: 307 10*3/uL (ref 150–400)
RBC: 4.91 MIL/uL (ref 4.22–5.81)
RDW: 12.5 % (ref 11.5–15.5)
WBC: 7.1 10*3/uL (ref 4.0–10.5)
nRBC: 0 % (ref 0.0–0.2)

## 2019-04-23 LAB — COMPREHENSIVE METABOLIC PANEL
ALT: 61 U/L — ABNORMAL HIGH (ref 0–44)
AST: 70 U/L — ABNORMAL HIGH (ref 15–41)
Albumin: 2.9 g/dL — ABNORMAL LOW (ref 3.5–5.0)
Alkaline Phosphatase: 69 U/L (ref 38–126)
Anion gap: 9 (ref 5–15)
BUN: 21 mg/dL — ABNORMAL HIGH (ref 6–20)
CO2: 24 mmol/L (ref 22–32)
Calcium: 8.7 mg/dL — ABNORMAL LOW (ref 8.9–10.3)
Chloride: 106 mmol/L (ref 98–111)
Creatinine, Ser: 0.6 mg/dL — ABNORMAL LOW (ref 0.61–1.24)
GFR calc Af Amer: >60 mL/min (ref >60)
GFR calc non Af Amer: >60 mL/min (ref >60)
Glucose, Bld: 271 mg/dL — ABNORMAL HIGH (ref 70–99)
Potassium: 4 mmol/L (ref 3.5–5.1)
Sodium: 139 mmol/L (ref 135–145)
Total Bilirubin: 0.3 mg/dL (ref 0.3–1.2)
Total Protein: 6.3 g/dL — ABNORMAL LOW (ref 6.5–8.1)

## 2019-04-23 LAB — FERRITIN: Ferritin: 524 ng/mL — ABNORMAL HIGH (ref 24–336)

## 2019-04-23 LAB — MAGNESIUM: Magnesium: 2.1 mg/dL (ref 1.7–2.4)

## 2019-04-23 LAB — PHOSPHORUS: Phosphorus: 4 mg/dL (ref 2.5–4.6)

## 2019-04-23 LAB — D-DIMER, QUANTITATIVE: D-Dimer, Quant: 1.05 ug/mL-FEU — ABNORMAL HIGH (ref 0.00–0.50)

## 2019-04-23 LAB — GLUCOSE, CAPILLARY: Glucose-Capillary: 214 mg/dL — ABNORMAL HIGH (ref 70–99)

## 2019-04-23 LAB — C-REACTIVE PROTEIN: CRP: 2.6 mg/dL — ABNORMAL HIGH (ref <1.0)

## 2019-04-23 MED ORDER — ATORVASTATIN CALCIUM 10 MG PO TABS: 10.0000 mg | ORAL_TABLET | Freq: Every day | ORAL | Status: AC

## 2019-04-23 MED ORDER — ORAL CARE MOUTH RINSE
15.0000 mL | Freq: Two times a day (BID) | OROMUCOSAL | Status: DC
  Administered 2019-04-23: 15 mL via OROMUCOSAL

## 2019-04-23 MED ORDER — SODIUM CHLORIDE 0.9 % IV SOLN
100.0000 mg | INTRAVENOUS | Status: AC
  Administered 2019-04-23: 100 mg via INTRAVENOUS
  Filled 2019-04-23: qty 20

## 2019-04-23 MED ORDER — ACETAMINOPHEN 325 MG PO TABS: 650.0000 mg | ORAL_TABLET | Freq: Four times a day (QID) | ORAL | Status: AC | PRN

## 2019-04-23 NOTE — Progress Notes (Addendum)
Diabetic coordinator called said pt would go home on insulin, notified Dr. Thereasa Solo for clarification. inulin is not on AVS and pt is ready to go. Waiting for response. 1641 patient will go home with current medications and f/u with PCP next week. Per Dr. Thereasa Solo

## 2019-04-23 NOTE — Discharge Instructions (Signed)
YOU MAY NOT RETURN TO WORK OR GO OUT IN PUBLIC UNTIL:  You have had no fever for at least 72 hours (that is three full days of no fever without the use of medicine that reduces fevers) AND other symptoms have improved (for example, when your cough or shortness of breath or diarrhea have improved) AND at least 10 days have passed since your symptoms first appeared      Person Under Monitoring Name: Brian Rivas  Location: [address] [place] [id number]   Infection Prevention Recommendations for Individuals Confirmed to have, or Being Evaluated for, 2019 Novel Coronavirus (COVID-19) Infection Who Receive Care at Home  Individuals who are confirmed to have, or are being evaluated for, COVID-19 should follow the prevention steps below until a healthcare provider or local or state health department says they can return to normal activities.  Stay home except to get medical care You should restrict activities outside your home, except for getting medical care. Do not go to work, school, or public areas, and do not use public transportation or taxis.  Call ahead before visiting your doctor Before your medical appointment, call the healthcare provider and tell them that you have, or are being evaluated for, COVID-19 infection. This will help the healthcare providers office take steps to keep other people from getting infected. Ask your healthcare provider to call the local or state health department.  Monitor your symptoms Seek prompt medical attention if your illness is worsening (e.g., difficulty breathing). Before going to your medical appointment, call the healthcare provider and tell them that you have, or are being evaluated for, COVID-19 infection. Ask your healthcare provider to call the local or state health department.  Wear a facemask You should wear a facemask that covers your nose and mouth when you are in the same room with other people and when you  visit a healthcare provider. People who live with or visit you should also wear a facemask while they are in the same room with you.  Separate yourself from other people in your home As much as possible, you should stay in a different room from other people in your home. Also, you should use a separate bathroom, if available.  Avoid sharing household items You should not share dishes, drinking glasses, cups, eating utensils, towels, bedding, or other items with other people in your home. After using these items, you should wash them thoroughly with soap and water.  Cover your coughs and sneezes Cover your mouth and nose with a tissue when you cough or sneeze, or you can cough or sneeze into your sleeve. Throw used tissues in a lined trash can, and immediately wash your hands with soap and water for at least 20 seconds or use an alcohol-based hand rub.  Wash your [employer]hands Wash your hands often and thoroughly with soap and water for at least 20 seconds. You can use an alcohol-based hand sanitizer if soap and water are not available and if your hands are not visibly dirty. Avoid touching your eyes, nose, and mouth with unwashed hands.   Prevention Steps for Caregivers and Household Members of Individuals Confirmed to have, or Being Evaluated for, COVID-19 Infection Being Cared for in the Home  If you live with, or provide care at home for, a person confirmed to have, or being evaluated for, COVID-19 infection please follow these guidelines to prevent infection:  Follow healthcare providers instructions Make sure that you understand and can help the patient  follow any healthcare provider instructions for all care.  Provide for the patients basic needs You should help the patient with basic needs in the home and provide support for getting groceries, prescriptions, and other personal needs.  Monitor the patients symptoms If they are getting sicker, call his or her medical provider and  tell them that the patient has, or is being evaluated for, COVID-19 infection. This will help the healthcare providers office take steps to keep other people from getting infected. Ask the healthcare provider to call the local or state health department.  Limit the number of people who have contact with the patient  If possible, have only one caregiver for the patient.  Other household members should stay in another home or place of residence. If this is not possible, they should stay  in another room, or be separated from the patient as much as possible. Use a separate bathroom, if available.  Restrict visitors who do not have an essential need to be in the home.  Keep older adults, very young children, and other sick people away from the patient Keep older adults, very young children, and those who have compromised immune systems or chronic health conditions away from the patient. This includes people with chronic heart, lung, or kidney conditions, diabetes, and cancer.  Ensure good ventilation Make sure that shared spaces in the home have good air flow, such as from an air conditioner or an opened window, weather permitting.  Wash your hands often  Wash your hands often and thoroughly with soap and water for at least 20 seconds. You can use an alcohol based hand sanitizer if soap and water are not available and if your hands are not visibly dirty.  Avoid touching your eyes, nose, and mouth with unwashed hands.  Use disposable paper towels to dry your hands. If not available, use dedicated cloth towels and replace them when they become wet.  Wear a facemask and gloves  Wear a disposable facemask at all times in the room and gloves when you touch or have contact with the patients blood, body fluids, and/or secretions or excretions, such as sweat, saliva, sputum, nasal mucus, vomit, urine, or feces.  Ensure the mask fits over your nose and mouth tightly, and do not touch it during  use.  Throw out disposable facemasks and gloves after using them. Do not reuse.  Wash your hands immediately after removing your facemask and gloves.  If your personal clothing becomes contaminated, carefully remove clothing and launder. Wash your hands after handling contaminated clothing.  Place all used disposable facemasks, gloves, and other waste in a lined container before disposing them with other household waste.  Remove gloves and wash your hands immediately after handling these items.  Do not share dishes, glasses, or other household items with the patient  Avoid sharing household items. You should not share dishes, drinking glasses, cups, eating utensils, towels, bedding, or other items with a patient who is confirmed to have, or being evaluated for, COVID-19 infection.  After the person uses these items, you should wash them thoroughly with soap and water.  Wash laundry thoroughly  Immediately remove and wash clothes or bedding that have blood, body fluids, and/or secretions or excretions, such as sweat, saliva, sputum, nasal mucus, vomit, urine, or feces, on them.  Wear gloves when handling laundry from the patient.  Read and follow directions on labels of laundry or clothing items and detergent. In general, wash and dry with the warmest temperatures recommended  on the label.  Clean all areas the individual has used often  Clean all touchable surfaces, such as counters, tabletops, doorknobs, bathroom fixtures, toilets, phones, keyboards, tablets, and bedside tables, every day. Also, clean any surfaces that may have blood, body fluids, and/or secretions or excretions on them.  Wear gloves when cleaning surfaces the patient has come in contact with.  Use a diluted bleach solution (e.g., dilute bleach with 1 part bleach and 10 parts water) or a household disinfectant with a label that says EPA-registered for coronaviruses. To make a bleach solution at home, add 1 tablespoon  of bleach to 1 quart (4 cups) of water. For a larger supply, add  cup of bleach to 1 gallon (16 cups) of water.  Read labels of cleaning products and follow recommendations provided on product labels. Labels contain instructions for safe and effective use of the cleaning product including precautions you should take when applying the product, such as wearing gloves or eye protection and making sure you have good ventilation during use of the product.  Remove gloves and wash hands immediately after cleaning.  Monitor yourself for signs and symptoms of illness Caregivers and household members are considered close contacts, should monitor their health, and will be asked to limit movement outside of the home to the extent possible. Follow the monitoring steps for close contacts listed on the symptom monitoring form.   ? If you have additional questions, contact your local health department or call the epidemiologist on call at [phone] (available 24/7). ? This guidance is subject to change. For the most up-to-date guidance from [facility], please refer to their website: [email or web address].pl

## 2019-04-23 NOTE — Progress Notes (Signed)
Spoke with pt via interpreter #[id number]/[phone].Pt has a PCP and states he will make an appointment.

## 2019-04-23 NOTE — Progress Notes (Addendum)
Thermometer sent home with patient interrupter used for AVS education [phone])

## 2019-04-23 NOTE — Discharge Summary (Signed)
DISCHARGE SUMMARY  Brian Rivas  MR#: [id number]  DOB:[birth year]  Date of Admission: 04/19/2019 Date of Discharge: 04/23/2019  Attending Physician:Jeffrey Silvestre Gunner McClung, MD  Patient's MWN:[id number]:Patient, No Pcp Per  Consults: none  Disposition: D/C home   Follow-up Appts: Follow-up Information    [facility] Follow up in 4 day(s).   Contact information: [address] [place] [phone]          Tests Needing Follow-up: -f/u LFTs in 3-5 days (post Remdesivir tx) -assess diabetes control  Discharge Diagnoses: Acute hypoxic respiratory failure COVID-19 Pneumonia Hyperlipidemia DM 2  COVID-19 specific Treatment: Steroids Remdesivir   Initial presentation: 48yo malew/ a hx of DM and HLD who presented with SOB, cough, and fever. In the ER he was tachypneic to the 30s, and a CXR noted multifocal PNA. He tested + for SARS-CoV-2, and his O2 sat initially was 90-93% on RA.  Hospital Course:  Acute Hypoxic Resp Failure - COVID PNA Has rapidly improved with use of steroids and Remdesivir -able to be weaned to room air prior to discharge -no complaints at time of discharge sitting comfortably in bedside chair communicating without difficulty  Very mild transaminitis LFTs are mildly elevated at the time of discharge -this may be a consequence of Remdesivir dosing -the patient is receiving his last dose on the date of his discharge -Lipitor will be held for 5 days -follow-up of his LFTs as an outpatient is suggested in 3 to 5 days  Dyslipidemia Continue home Lipitor -hold for 5 days given very mild LFT elevation in setting of REM disorder use -outpatient follow-up of LFTs suggested  DM2 Encouraged strict diabetic diet at home and close monitoring of CBGs -patient confirms that he has a primary care doctor with whom he can follow-up for ongoing diabetic care  Allergies as of 04/23/2019   No Known Allergies     Medication List     TAKE these medications   acetaminophen 325 MG tablet Commonly known as: TYLENOL Take 2 tablets (650 mg total) by mouth every 6 (six) hours as needed for mild pain or headache (fever >/= 101).   atorvastatin 10 MG tablet Commonly known as: LIPITOR Take 1 tablet (10 mg total) by mouth daily. Start taking on: April 28, 2019 What changed: These instructions start on April 28, 2019. If you are unsure what to do until then, ask your doctor or other care provider.   metFORMIN 1000 MG tablet Commonly known as: GLUCOPHAGE Take 1,000 mg by mouth 2 (two) times daily with a meal.       Day of Discharge BP 106/72 (BP Location: Left Arm)   Pulse 71   Temp 98.7 F (37.1 C) (Oral)   Resp 19   Ht 5\' 4"  (1.626 m)   Wt 74.8 kg   SpO2 91%   BMI 28.32 kg/m   Physical Exam: General: No acute respiratory distress Lungs: Faint diffuse crackles with good air movement throughout with no wheezing Cardiovascular: Regular rate and rhythm without murmur gallop or rub normal S1 and S2 Abdomen: Nontender, nondistended, soft, bowel sounds positive, no rebound, no ascites, no appreciable mass Extremities: No significant cyanosis, clubbing, or edema bilateral lower extremities  Basic Metabolic Panel: Recent Labs  Lab 04/19/19 1352 04/20/19 0500 04/21/19 0430 04/22/19 0315 04/23/19 0352  NA 135 138 139 140 139  K 4.1 4.4 4.3 4.3 4.0  CL 100 102 105 104 106  CO2 23 24 24 24 24   GLUCOSE 196* 235*  201* 190* 271*  BUN 11 18 22* 25* 21*  CREATININE 0.79 0.70 0.71 0.62 0.60*  CALCIUM 8.5* 8.4* 8.2* 8.3* 8.7*  MG  --  2.1 2.1 2.5* 2.1  PHOS  --  3.9 2.8 4.0 4.0    Liver Function Tests: Recent Labs  Lab 04/19/19 2144 04/20/19 0500 04/21/19 0430 04/22/19 0315 04/23/19 0352  AST 28 25 21 20  70*  ALT 22 21 22 20  61*  ALKPHOS 66 68 65 65 69  BILITOT 0.9 0.4 0.3 0.3 0.3  PROT 6.7 7.1 6.5 6.5 6.3*  ALBUMIN 2.9* 3.1* 2.9* 2.9* 2.9*    CBC: Recent Labs  Lab 04/19/19 1352 04/20/19 0500  04/21/19 0430 04/22/19 0315 04/23/19 0352  WBC 6.7 5.7 7.6 5.3 7.1  NEUTROABS 5.1 4.9 5.5 3.7 4.6  HGB 15.9 15.9 15.7 15.1 15.0  HCT 47.0 48.6 47.4 46.9 44.7  MCV 89.9 92.4 91.7 92.0 91.0  PLT 182 140* 246 265 307    CBG: Recent Labs  Lab 04/22/19 0756 04/22/19 1215 04/22/19 1650 04/22/19 2139 04/23/19 0742  GLUCAP 239* 247* 295* 351* 214*    Recent Results (from the past 240 hour(s))  Culture, blood (routine x 2)     Status: None (Preliminary result)   Collection Time: 04/19/19  2:00 PM   Specimen: BLOOD RIGHT FOREARM  Result Value Ref Range Status   Specimen Description BLOOD RIGHT FOREARM  Final   Special Requests   Final    BOTTLES DRAWN AEROBIC AND ANAEROBIC Blood Culture adequate volume   Culture   Final    NO GROWTH 4 DAYS Performed at [facility], [place] [address] [place], [place] [address]    Report Status PENDING  Incomplete  Culture, blood (routine x 2)     Status: None (Preliminary result)   Collection Time: 04/19/19  2:15 PM   Specimen: BLOOD  Result Value Ref Range Status   Specimen Description BLOOD LEFT ANTECUBITAL  Final   Special Requests   Final    BOTTLES DRAWN AEROBIC AND ANAEROBIC Blood Culture adequate volume   Culture   Final    NO GROWTH 4 DAYS Performed at [facility], [place] [address] [place], [place] [address]    Report Status PENDING  Incomplete  SARS Coronavirus 2     Status: Abnormal   Collection Time: 04/19/19  2:53 PM  Result Value Ref Range Status   SARS Coronavirus 2 DETECTED (A) NOT DETECTED Final    Comment: CRITICAL RESULT CALLED TO, READ BACK BY AND VERIFIED WITH: Revonda Humphrey, ED RN AT 1630 ON 04/19/19 BY C. JESSUP, MLT. (NOTE) SARS-CoV-2 target nucleic acids are DETECTED. The SARS-CoV-2 RNA is generally detectable in upper and lower respiratory specimens during the acute phase of infection. Positive results are indicative of active infection with SARS-CoV-2. Clinical  correlation with patient history and other  diagnostic information is necessary to determine patient infection status. Positive results do  not rule out bacterial infection or co-infection with other viruses. The expected result is Not Detected. Fact Sheet for Patients: http://www.biofiredefense.com/wp-content/uploads/2020/03/BIOFIRE-COVID -19-patients.pdf Fact Sheet for Healthcare Providers: http://www.biofiredefense.com/wp-content/uploads/2020/03/BIOFIRE-COVID -19-hcp.pdf This test is not yet approved or cleared by the [place] and  has been authorized for detection and/or diagnosis of SARS-CoV-2 by  FDA under an Emergency Use Authorization (EUA).  This EUA will remain in effect (meaning this test can be used) for the duration of  the COVID-19 declaration under Section 564(b)(1) of the Act, 21 U.S.C. section 562ZHY 3(b)(1),  unless the authorization is terminated or revoked sooner. Performed at [facility], [address] [place], [place] [address]      Time spent in discharge (includes decision making & examination of pt): 35 minutes  04/23/2019, 3:23 PM   Lonia BloodJeffrey T. McClung, MD [facility] Office  [phone]

## 2019-04-23 NOTE — Progress Notes (Signed)
provided education on how to self administer insulin. Pt returned demonstration appropriately.

## 2019-04-23 NOTE — Progress Notes (Signed)
Weaned pt room air sats decreased 88 %. Reapplied 2 L

## 2019-04-24 LAB — CULTURE, BLOOD (ROUTINE X 2)
Culture: NO GROWTH
Culture: NO GROWTH
Special Requests: ADEQUATE
Special Requests: ADEQUATE

## 2019-04-24 LAB — GLUCOSE, CAPILLARY: Glucose-Capillary: 286 mg/dL — ABNORMAL HIGH (ref 70–99)

## 2019-05-01 ENCOUNTER — Inpatient Hospital Stay: Payer: 59 | Admitting: Family Medicine

## 2019-05-23 ENCOUNTER — Ambulatory Visit: Payer: Self-pay | Admitting: Psychiatry

## 2020-09-28 IMAGING — DX PORTABLE CHEST - 1 VIEW
1 series · 1 of 1 positions shown · non-contrast
Comparison: None.

CLINICAL DATA: Cough since yesterday with chest pain.  Fever.

EXAM:
PORTABLE CHEST 1 VIEW

[chest ap]
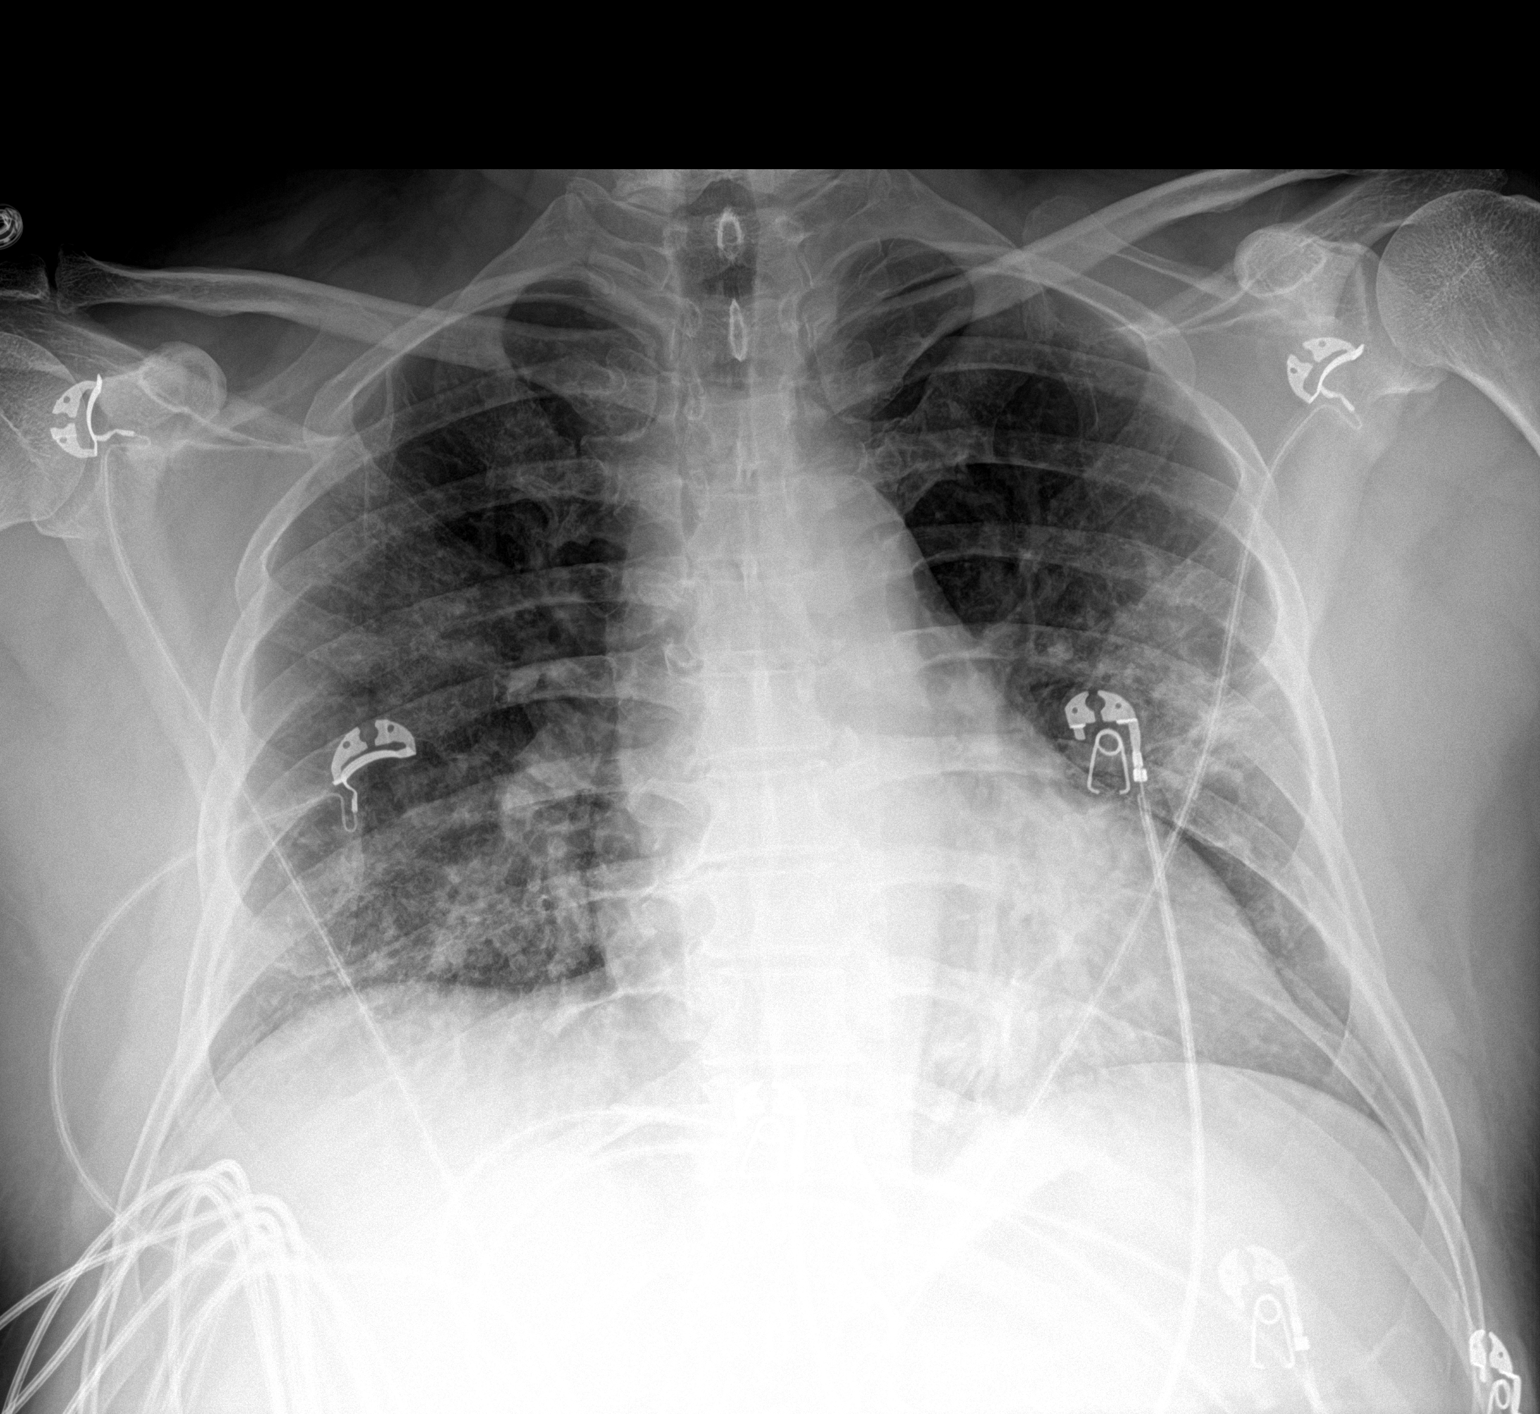

[1 of 1 positions shown; findings below may reference images not displayed]

FINDINGS: Indistinct airspace opacity in the left mid lung. Indistinct linear
opacities at the lung bases.

Low lung volumes.  Upper normal heart size.  No pleural effusion.
IMPRESSION: 1. Indistinct airspace opacity in the left mid lung with
questionable linear opacities at the lung bases. Possibilities might
include bacterial pneumonia, atypical pneumonia, or aspiration
pneumonitis.

## 2020-12-14 ENCOUNTER — Encounter (INDEPENDENT_AMBULATORY_CARE_PROVIDER_SITE_OTHER): Payer: 59 | Admitting: Ophthalmology

## 2022-02-24 ENCOUNTER — Encounter: Payer: Self-pay | Admitting: Medical
